# Patient Record
Sex: Female | Born: 1970
Health system: Southern US, Community
[De-identification: ages and names within clinical notes are randomized; demographics above are authoritative.]

## PROBLEM LIST (undated history)

## (undated) DIAGNOSIS — I10 Essential (primary) hypertension: Secondary | ICD-10-CM

## (undated) DIAGNOSIS — K219 Gastro-esophageal reflux disease without esophagitis: Secondary | ICD-10-CM

## (undated) DIAGNOSIS — K760 Fatty (change of) liver, not elsewhere classified: Secondary | ICD-10-CM

## (undated) DIAGNOSIS — K589 Irritable bowel syndrome without diarrhea: Secondary | ICD-10-CM

## (undated) DIAGNOSIS — D649 Anemia, unspecified: Secondary | ICD-10-CM

## (undated) DIAGNOSIS — F419 Anxiety disorder, unspecified: Secondary | ICD-10-CM

## (undated) DIAGNOSIS — D1803 Hemangioma of intra-abdominal structures: Secondary | ICD-10-CM

## (undated) HISTORY — PX: CHOLECYSTECTOMY: SHX55

## (undated) HISTORY — DX: Anemia, unspecified: D64.9

## (undated) HISTORY — DX: Fatty (change of) liver, not elsewhere classified: K76.0

## (undated) HISTORY — DX: Irritable bowel syndrome, unspecified: K58.9

## (undated) HISTORY — DX: Hemangioma of intra-abdominal structures: D18.03

## (undated) HISTORY — DX: Anxiety disorder, unspecified: F41.9

## (undated) HISTORY — PX: BREAST BIOPSY: SHX20

---

## 2017-03-21 DIAGNOSIS — Z6838 Body mass index (BMI) 38.0-38.9, adult: Secondary | ICD-10-CM | POA: Diagnosis not present

## 2017-03-21 DIAGNOSIS — R002 Palpitations: Secondary | ICD-10-CM | POA: Diagnosis not present

## 2017-03-21 DIAGNOSIS — I10 Essential (primary) hypertension: Secondary | ICD-10-CM | POA: Diagnosis not present

## 2017-03-21 DIAGNOSIS — F411 Generalized anxiety disorder: Secondary | ICD-10-CM | POA: Diagnosis not present

## 2017-03-26 DIAGNOSIS — F411 Generalized anxiety disorder: Secondary | ICD-10-CM | POA: Diagnosis not present

## 2017-04-15 DIAGNOSIS — F419 Anxiety disorder, unspecified: Secondary | ICD-10-CM | POA: Diagnosis not present

## 2017-04-15 DIAGNOSIS — Z6838 Body mass index (BMI) 38.0-38.9, adult: Secondary | ICD-10-CM | POA: Diagnosis not present

## 2017-04-15 DIAGNOSIS — R1033 Periumbilical pain: Secondary | ICD-10-CM | POA: Diagnosis not present

## 2017-04-15 DIAGNOSIS — R030 Elevated blood-pressure reading, without diagnosis of hypertension: Secondary | ICD-10-CM | POA: Diagnosis not present

## 2017-04-15 DIAGNOSIS — N926 Irregular menstruation, unspecified: Secondary | ICD-10-CM | POA: Diagnosis not present

## 2017-05-12 DIAGNOSIS — R51 Headache: Secondary | ICD-10-CM | POA: Diagnosis not present

## 2017-05-12 DIAGNOSIS — H9201 Otalgia, right ear: Secondary | ICD-10-CM | POA: Diagnosis not present

## 2017-05-12 DIAGNOSIS — F411 Generalized anxiety disorder: Secondary | ICD-10-CM | POA: Diagnosis not present

## 2017-05-19 DIAGNOSIS — Z6833 Body mass index (BMI) 33.0-33.9, adult: Secondary | ICD-10-CM | POA: Diagnosis not present

## 2017-05-19 DIAGNOSIS — R51 Headache: Secondary | ICD-10-CM | POA: Diagnosis not present

## 2017-05-28 DIAGNOSIS — R51 Headache: Secondary | ICD-10-CM | POA: Diagnosis not present

## 2017-05-28 DIAGNOSIS — H9201 Otalgia, right ear: Secondary | ICD-10-CM | POA: Diagnosis not present

## 2017-05-30 DIAGNOSIS — R2689 Other abnormalities of gait and mobility: Secondary | ICD-10-CM | POA: Diagnosis not present

## 2017-05-30 DIAGNOSIS — F411 Generalized anxiety disorder: Secondary | ICD-10-CM | POA: Diagnosis not present

## 2017-05-30 DIAGNOSIS — R009 Unspecified abnormalities of heart beat: Secondary | ICD-10-CM | POA: Diagnosis not present

## 2017-05-30 DIAGNOSIS — Z6830 Body mass index (BMI) 30.0-30.9, adult: Secondary | ICD-10-CM | POA: Diagnosis not present

## 2017-05-30 DIAGNOSIS — I10 Essential (primary) hypertension: Secondary | ICD-10-CM | POA: Diagnosis not present

## 2017-06-01 DIAGNOSIS — M792 Neuralgia and neuritis, unspecified: Secondary | ICD-10-CM | POA: Diagnosis not present

## 2017-06-01 DIAGNOSIS — R5381 Other malaise: Secondary | ICD-10-CM | POA: Diagnosis not present

## 2017-06-01 DIAGNOSIS — F329 Major depressive disorder, single episode, unspecified: Secondary | ICD-10-CM | POA: Diagnosis not present

## 2017-06-01 DIAGNOSIS — F419 Anxiety disorder, unspecified: Secondary | ICD-10-CM | POA: Diagnosis not present

## 2017-06-01 DIAGNOSIS — R42 Dizziness and giddiness: Secondary | ICD-10-CM | POA: Diagnosis not present

## 2017-06-01 DIAGNOSIS — R51 Headache: Secondary | ICD-10-CM | POA: Diagnosis not present

## 2017-06-01 DIAGNOSIS — M542 Cervicalgia: Secondary | ICD-10-CM | POA: Diagnosis not present

## 2017-06-01 DIAGNOSIS — I10 Essential (primary) hypertension: Secondary | ICD-10-CM | POA: Diagnosis not present

## 2017-06-23 DIAGNOSIS — M542 Cervicalgia: Secondary | ICD-10-CM | POA: Diagnosis not present

## 2017-06-25 DIAGNOSIS — M542 Cervicalgia: Secondary | ICD-10-CM | POA: Diagnosis not present

## 2017-06-28 DIAGNOSIS — M542 Cervicalgia: Secondary | ICD-10-CM | POA: Diagnosis not present

## 2017-07-02 DIAGNOSIS — M542 Cervicalgia: Secondary | ICD-10-CM | POA: Diagnosis not present

## 2017-07-07 DIAGNOSIS — M542 Cervicalgia: Secondary | ICD-10-CM | POA: Diagnosis not present

## 2017-07-12 DIAGNOSIS — M542 Cervicalgia: Secondary | ICD-10-CM | POA: Diagnosis not present

## 2017-07-14 DIAGNOSIS — M542 Cervicalgia: Secondary | ICD-10-CM | POA: Diagnosis not present

## 2017-07-19 DIAGNOSIS — M542 Cervicalgia: Secondary | ICD-10-CM | POA: Diagnosis not present

## 2017-07-21 DIAGNOSIS — M542 Cervicalgia: Secondary | ICD-10-CM | POA: Diagnosis not present

## 2017-07-26 DIAGNOSIS — M542 Cervicalgia: Secondary | ICD-10-CM | POA: Diagnosis not present

## 2017-07-28 DIAGNOSIS — M542 Cervicalgia: Secondary | ICD-10-CM | POA: Diagnosis not present

## 2017-08-11 DIAGNOSIS — M542 Cervicalgia: Secondary | ICD-10-CM | POA: Diagnosis not present

## 2017-08-24 ENCOUNTER — Telehealth: Payer: Self-pay | Admitting: Family

## 2017-08-24 DIAGNOSIS — J019 Acute sinusitis, unspecified: Secondary | ICD-10-CM

## 2017-08-24 DIAGNOSIS — B9689 Other specified bacterial agents as the cause of diseases classified elsewhere: Secondary | ICD-10-CM

## 2017-08-24 MED ORDER — AMOXICILLIN-POT CLAVULANATE 875-125 MG PO TABS: 1.0000 | ORAL_TABLET | Freq: Two times a day (BID) | ORAL | 0 refills | Status: DC

## 2017-08-24 NOTE — Progress Notes (Signed)

## 2017-08-30 DIAGNOSIS — M542 Cervicalgia: Secondary | ICD-10-CM | POA: Diagnosis not present

## 2017-09-01 DIAGNOSIS — M542 Cervicalgia: Secondary | ICD-10-CM | POA: Diagnosis not present

## 2017-09-09 DIAGNOSIS — Z6834 Body mass index (BMI) 34.0-34.9, adult: Secondary | ICD-10-CM | POA: Diagnosis not present

## 2017-09-09 DIAGNOSIS — R194 Change in bowel habit: Secondary | ICD-10-CM | POA: Diagnosis not present

## 2017-09-09 DIAGNOSIS — K589 Irritable bowel syndrome without diarrhea: Secondary | ICD-10-CM | POA: Diagnosis not present

## 2017-09-09 DIAGNOSIS — F419 Anxiety disorder, unspecified: Secondary | ICD-10-CM | POA: Diagnosis not present

## 2017-09-09 DIAGNOSIS — K219 Gastro-esophageal reflux disease without esophagitis: Secondary | ICD-10-CM | POA: Diagnosis not present

## 2017-09-09 DIAGNOSIS — R1013 Epigastric pain: Secondary | ICD-10-CM | POA: Diagnosis not present

## 2017-12-02 ENCOUNTER — Institutional Professional Consult (permissible substitution): Payer: Self-pay | Admitting: Emergency Medicine

## 2018-04-08 DIAGNOSIS — Z79899 Other long term (current) drug therapy: Secondary | ICD-10-CM | POA: Diagnosis not present

## 2018-04-08 DIAGNOSIS — I10 Essential (primary) hypertension: Secondary | ICD-10-CM | POA: Diagnosis not present

## 2018-04-08 DIAGNOSIS — H6503 Acute serous otitis media, bilateral: Secondary | ICD-10-CM | POA: Diagnosis not present

## 2018-04-08 DIAGNOSIS — J302 Other seasonal allergic rhinitis: Secondary | ICD-10-CM | POA: Diagnosis not present

## 2018-04-11 DIAGNOSIS — H6690 Otitis media, unspecified, unspecified ear: Secondary | ICD-10-CM | POA: Diagnosis not present

## 2018-04-11 DIAGNOSIS — Z6835 Body mass index (BMI) 35.0-35.9, adult: Secondary | ICD-10-CM | POA: Diagnosis not present

## 2018-04-11 DIAGNOSIS — R079 Chest pain, unspecified: Secondary | ICD-10-CM | POA: Diagnosis not present

## 2018-04-11 DIAGNOSIS — R5381 Other malaise: Secondary | ICD-10-CM | POA: Diagnosis not present

## 2018-04-11 DIAGNOSIS — J45909 Unspecified asthma, uncomplicated: Secondary | ICD-10-CM | POA: Diagnosis not present

## 2018-04-11 DIAGNOSIS — J019 Acute sinusitis, unspecified: Secondary | ICD-10-CM | POA: Diagnosis not present

## 2018-04-11 DIAGNOSIS — R0600 Dyspnea, unspecified: Secondary | ICD-10-CM | POA: Diagnosis not present

## 2018-04-26 DIAGNOSIS — F419 Anxiety disorder, unspecified: Secondary | ICD-10-CM | POA: Diagnosis not present

## 2018-04-26 DIAGNOSIS — G8929 Other chronic pain: Secondary | ICD-10-CM | POA: Diagnosis not present

## 2018-04-26 DIAGNOSIS — Z6835 Body mass index (BMI) 35.0-35.9, adult: Secondary | ICD-10-CM | POA: Diagnosis not present

## 2018-04-26 DIAGNOSIS — K589 Irritable bowel syndrome without diarrhea: Secondary | ICD-10-CM | POA: Diagnosis not present

## 2018-04-26 DIAGNOSIS — R1011 Right upper quadrant pain: Secondary | ICD-10-CM | POA: Diagnosis not present

## 2018-04-26 DIAGNOSIS — K219 Gastro-esophageal reflux disease without esophagitis: Secondary | ICD-10-CM | POA: Diagnosis not present

## 2018-05-16 ENCOUNTER — Encounter (HOSPITAL_COMMUNITY): Payer: Self-pay | Admitting: *Deleted

## 2018-05-16 ENCOUNTER — Emergency Department (HOSPITAL_COMMUNITY): Payer: 59

## 2018-05-16 ENCOUNTER — Emergency Department (HOSPITAL_COMMUNITY)
Admission: EM | Admit: 2018-05-16 | Discharge: 2018-05-16 | Disposition: A | Discharge status: Home / Self Care | Payer: 59 | Attending: Emergency Medicine | Admitting: Emergency Medicine

## 2018-05-16 DIAGNOSIS — I10 Essential (primary) hypertension: Secondary | ICD-10-CM | POA: Insufficient documentation

## 2018-05-16 DIAGNOSIS — M542 Cervicalgia: Secondary | ICD-10-CM | POA: Diagnosis not present

## 2018-05-16 DIAGNOSIS — Z79899 Other long term (current) drug therapy: Secondary | ICD-10-CM | POA: Insufficient documentation

## 2018-05-16 DIAGNOSIS — R079 Chest pain, unspecified: Secondary | ICD-10-CM | POA: Diagnosis present

## 2018-05-16 HISTORY — DX: Essential (primary) hypertension: I10

## 2018-05-16 LAB — I-STAT TROPONIN, ED
Troponin i, poc: 0 ng/mL (ref 0.00–0.08)
Troponin i, poc: 0.01 ng/mL (ref 0.00–0.08)

## 2018-05-16 LAB — I-STAT BETA HCG BLOOD, ED (MC, WL, AP ONLY)

## 2018-05-16 LAB — BASIC METABOLIC PANEL
ANION GAP: 10 (ref 5–15)
BUN: 10 mg/dL (ref 6–20)
CHLORIDE: 106 mmol/L (ref 98–111)
CO2: 25 mmol/L (ref 22–32)
Calcium: 8.8 mg/dL — ABNORMAL LOW (ref 8.9–10.3)
Creatinine, Ser: 0.7 mg/dL (ref 0.44–1.00)
Glucose, Bld: 105 mg/dL — ABNORMAL HIGH (ref 70–99)
POTASSIUM: 4.2 mmol/L (ref 3.5–5.1)
SODIUM: 141 mmol/L (ref 135–145)

## 2018-05-16 LAB — CBC
HEMATOCRIT: 41 % (ref 36.0–46.0)
HEMOGLOBIN: 13.4 g/dL (ref 12.0–15.0)
MCH: 30.4 pg (ref 26.0–34.0)
MCHC: 32.7 g/dL (ref 30.0–36.0)
MCV: 93 fL (ref 78.0–100.0)
Platelets: 260 10*3/uL (ref 150–400)
RBC: 4.41 MIL/uL (ref 3.87–5.11)
RDW: 13.3 % (ref 11.5–15.5)
WBC: 12.3 10*3/uL — AB (ref 4.0–10.5)

## 2018-05-16 MED ORDER — ASPIRIN 81 MG PO CHEW
324.0000 mg | CHEWABLE_TABLET | Freq: Once | ORAL | Status: AC
  Administered 2018-05-16: 324 mg via ORAL
  Filled 2018-05-16: qty 4

## 2018-05-16 NOTE — ED Triage Notes (Addendum)
Pt complains of elevated BP, blurred vision, neck pain. Pt had BP of 171/101 when checked today. Pt works at [employer], was walking down hallway when symptoms began. Pt states her vision was blurred for a few minutes, states blurred vision has improved but neck still hurts. Pt was prescribed antihypertension medication a few weeks ago but states she is scared to take it.

## 2018-05-16 NOTE — Discharge Instructions (Addendum)
Your blood pressure normalized to a suitable range with your verapamil and is 135/79 at discharge down from an initial blood pressure of 180/101.  Please take your medication daily as directed.  Your work-up here is otherwise unremarkable and I do not think he had an emergent cause of your symptoms today however I do think it is imperative that you continue to monitor and control your blood pressure to prevent any long-term or catastrophic outcomes.  Please follow-up in the next 2 days with your physician.  Contact a health care provider if: You think you are having a reaction to a medicine you are taking. You have headaches that keep coming back (recurring). You feel dizzy. You have swelling in your ankles. You have trouble with your vision. Get help right away if: You develop a severe headache or confusion. You have unusual weakness or numbness. You feel faint. You have severe pain in your chest or abdomen. You vomit repeatedly. You have trouble breathing.

## 2018-05-16 NOTE — ED Provider Notes (Signed)
[facility] DEPT Provider Note   CSN: [id number] Arrival date & time: 05/16/18  1045     History   Chief Complaint Chief Complaint  Patient presents with  . Hypertension  . Neck Pain  . Blurred Vision    HPI Kimble Delaurentis is a 47 y.o. female who presents the emergency department with chief complaint of chest pain.  Patient is a 47 year old female with a past medical history of hypertension.  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  Her PCP started on her verapamil.  The patient states that she has been afraid to take the medication because the label says that she cannot take it with grapefruit and states "that cannot be good."  Today the patient was under stressful situation at work states that she began having chest pain radiating into the right side of her neck.  She states that she also felt like her eyes became blurry.  All of her sensations lasted for about 10 minutes.  She went into the IR suite here and was noted to have hypertension.  The patient has not been taking any of her medications.  She is asymptomatic currently denies chest pain, shortness of breath, diaphoresis.  She has no other risk factors for ACS.  The patient denies headache.  She has no other changes in vision such as diplopia or vision loss. HPI  Past Medical History:  Diagnosis Date  . Hypertension     There are no active problems to display for this patient.    The histories are not reviewed yet. Please review them in the "History" navigator section and refresh this [place].   OB History   None      Home Medications    Prior to Admission medications   Medication Sig Start Date End Date Taking? Authorizing Provider  fluticasone (FLONASE) 50 MCG/ACT nasal spray Place 1 spray into both nostrils 2  (two) times daily as needed for allergies or rhinitis.   Yes [provider]  verapamil (VERELAN) 100 MG 24 hr capsule Take 100 mg by mouth daily. 04/12/18  Yes [provider]  amoxicillin-clavulanate (AUGMENTIN) 875-125 MG tablet Take 1 tablet by mouth 2 (two) times daily. Patient not taking: Reported on 05/16/2018 08/24/17   Kennyth Arnold, FNP    Family History No family history on file.  Social History Social History   Tobacco Use  . Smoking status: Not on file  Substance Use Topics  . Alcohol use: Not on file  . Drug use: Not on file     Allergies   Doxycycline and Minocycline   Review of Systems Review of Systems Ten systems reviewed and are negative for acute change, except as noted in the HPI.    Physical Exam Updated Vital Signs BP (!) 153/90   Pulse 91   Temp 98.2 F (36.8 C) (Oral)   Resp 16   LMP 04/15/2018 (Approximate)   SpO2 100%   Physical Exam  Constitutional: She is oriented to person, place, and time. She appears well-developed and well-nourished. No distress.  HENT:  Head: Normocephalic and atraumatic.  Eyes: Pupils are equal, round, and reactive to light. Conjunctivae and EOM are normal. No scleral icterus.  Neck: Normal range of motion.  Cardiovascular: Normal rate, regular rhythm and normal heart sounds. Exam reveals no  gallop and no friction rub.  No murmur heard. Pulmonary/Chest: Effort normal and breath sounds normal. No respiratory distress.  Abdominal: Soft. Bowel sounds are normal. She exhibits no distension and no mass. There is no tenderness. There is no guarding.  Musculoskeletal: She exhibits tenderness. She exhibits no edema or deformity.  Tenderness along the right side of the neck, pain with right lateral flexion and right lateral rotation  Neurological: She is alert and oriented to person, place, and time.  Speech is clear and goal oriented, follows commands Major Cranial nerves without deficit, no facial  droop Normal strength in upper and lower extremities bilaterally including dorsiflexion and plantar flexion, strong and equal grip strength Sensation normal to light and sharp touch Moves extremities without ataxia, coordination intact Normal finger to nose and rapid alternating movements Neg romberg, no pronator drift Normal gait Normal heel-shin and balance   Skin: Skin is warm and dry. Capillary refill takes less than 2 seconds. She is not diaphoretic.  Psychiatric: Her behavior is normal.  Nursing note and vitals reviewed.    ED Treatments / Results  Labs (all labs ordered are listed, but only abnormal results are displayed) Labs Reviewed  BASIC METABOLIC PANEL - Abnormal; Notable for the following components:      Result Value   Glucose, Bld 105 (*)    Calcium 8.8 (*)    All other components within normal limits  CBC - Abnormal; Notable for the following components:   WBC 12.3 (*)    All other components within normal limits  I-STAT TROPONIN, ED  I-STAT BETA HCG BLOOD, ED ([facility], [facility], [facility] ONLY)  I-STAT TROPONIN, ED    EKG EKG Interpretation  Date/Time:  Monday May 16 2018 12:21:04 EDT Ventricular Rate:  87 PR Interval:    QRS Duration: 76 QT Interval:  361 QTC Calculation: 435 R Axis:   23 Text Interpretation:  Sinus rhythm No old tracing to compare Confirmed by Duffy Bruce [phone]) on 05/16/2018 12:59:39 PM Also confirmed by Duffy Bruce [phone]), editor Shon Hale [phone])  on 05/16/2018 2:01:20 PM   Radiology Dg Chest 2 View  Result Date: 05/16/2018 CLINICAL DATA:  Blurry vision and hypertension. EXAM: CHEST - 2 VIEW COMPARISON:  None. FINDINGS: The heart size and mediastinal contours are within normal limits. Both lungs are clear. The visualized skeletal structures are unremarkable. IMPRESSION: Normal chest. Electronically Signed   By: Titus Dubin M.D.   On: 05/16/2018 12:56    Procedures Procedures (including critical care time)  Medications  Ordered in ED Medications  aspirin chewable tablet 324 mg (324 mg Oral Given 05/16/18 1236)     Initial Impression / Assessment and Plan / ED Course  I have reviewed the triage vital signs and the nursing notes.  Pertinent labs & imaging results that were available during my care of the patient were reviewed by me and considered in my medical decision making (see chart for details).  Clinical Course as of May 16 1541  Mon May 16, 2018  1412 Patient's BP improved after her oral verapimil.   BP(!): 153/90 [AH]  1538 Pulse Rate: 83 [AH]  1539 Patient's blood pressure has normalized with her verapamil.  Her EKG is unremarkable for ischemic abnormality she has 2- troponins here in the emergency department and a heart score of 3 making her low risk for ACS.  BP: 135/79 [AH]  1540 Patient has a normal neurologic examination I have very low suspicion for TIA.  She has under significant stress  and I do believe that she may have had a stress reaction or panic attack.   [AH]  1540 Patient meets PERC criteria.   [AH]  5974 I reviewed the patient's 2 view chest x-ray and see no significant abnormality such as consolidation, pneumothorax, mediastinal widening suggestive of aortic dissection/aneurysm.  I agree with the radiologic interpretation.   [AH]    Clinical Course User Index [AH] Margarita Mail, PA-C     Patient's blood pressure has decreased significantly with a single dose of oral medications.  She is now normotensive down from a pressure of 180/101.  She has a normal neurologic exam, 2- troponins, low risk heart score.  The patient states that she had marketed emotional stress at work this morning which I think is contributing to her symptoms today but feels much better at this time.  She is currently asymptomatic.  Encourage the patient to continue taking her blood pressure medications and see her PCP in the next 2 days.  We discussed return precautions.  She appears appropriate for  discharge at this time Final Clinical Impressions(s) / ED Diagnoses   Final diagnoses:  Hypertension, unspecified type    ED Discharge Orders    None       Margarita Mail, PA-C 05/16/18 1650    Duffy Bruce, MD 05/17/18 [phone]

## 2018-06-21 DIAGNOSIS — Z6836 Body mass index (BMI) 36.0-36.9, adult: Secondary | ICD-10-CM | POA: Diagnosis not present

## 2018-06-21 DIAGNOSIS — R51 Headache: Secondary | ICD-10-CM | POA: Diagnosis not present

## 2018-06-21 DIAGNOSIS — R5381 Other malaise: Secondary | ICD-10-CM | POA: Diagnosis not present

## 2018-06-21 DIAGNOSIS — J019 Acute sinusitis, unspecified: Secondary | ICD-10-CM | POA: Diagnosis not present

## 2018-06-21 DIAGNOSIS — H6690 Otitis media, unspecified, unspecified ear: Secondary | ICD-10-CM | POA: Diagnosis not present

## 2018-06-21 DIAGNOSIS — Z0189 Encounter for other specified special examinations: Secondary | ICD-10-CM | POA: Diagnosis not present

## 2018-06-21 DIAGNOSIS — J309 Allergic rhinitis, unspecified: Secondary | ICD-10-CM | POA: Diagnosis not present

## 2018-08-03 DIAGNOSIS — M542 Cervicalgia: Secondary | ICD-10-CM | POA: Diagnosis not present

## 2018-08-03 DIAGNOSIS — R51 Headache: Secondary | ICD-10-CM | POA: Diagnosis not present

## 2018-09-06 DIAGNOSIS — J019 Acute sinusitis, unspecified: Secondary | ICD-10-CM | POA: Diagnosis not present

## 2018-09-06 DIAGNOSIS — H6690 Otitis media, unspecified, unspecified ear: Secondary | ICD-10-CM | POA: Diagnosis not present

## 2018-09-06 DIAGNOSIS — Z6837 Body mass index (BMI) 37.0-37.9, adult: Secondary | ICD-10-CM | POA: Diagnosis not present

## 2018-09-06 DIAGNOSIS — R42 Dizziness and giddiness: Secondary | ICD-10-CM | POA: Diagnosis not present

## 2018-09-06 DIAGNOSIS — R05 Cough: Secondary | ICD-10-CM | POA: Diagnosis not present

## 2018-11-02 DIAGNOSIS — R1033 Periumbilical pain: Secondary | ICD-10-CM | POA: Diagnosis not present

## 2018-11-02 DIAGNOSIS — R5381 Other malaise: Secondary | ICD-10-CM | POA: Diagnosis not present

## 2018-11-02 DIAGNOSIS — N951 Menopausal and female climacteric states: Secondary | ICD-10-CM | POA: Diagnosis not present

## 2018-11-02 DIAGNOSIS — Z0000 Encounter for general adult medical examination without abnormal findings: Secondary | ICD-10-CM | POA: Diagnosis not present

## 2018-11-02 DIAGNOSIS — R7309 Other abnormal glucose: Secondary | ICD-10-CM | POA: Diagnosis not present

## 2018-11-02 DIAGNOSIS — Z6838 Body mass index (BMI) 38.0-38.9, adult: Secondary | ICD-10-CM | POA: Diagnosis not present

## 2018-11-08 DIAGNOSIS — G8929 Other chronic pain: Secondary | ICD-10-CM | POA: Diagnosis not present

## 2018-11-08 DIAGNOSIS — K589 Irritable bowel syndrome without diarrhea: Secondary | ICD-10-CM | POA: Diagnosis not present

## 2018-11-08 DIAGNOSIS — K219 Gastro-esophageal reflux disease without esophagitis: Secondary | ICD-10-CM | POA: Diagnosis not present

## 2018-11-08 DIAGNOSIS — R1011 Right upper quadrant pain: Secondary | ICD-10-CM | POA: Diagnosis not present

## 2018-11-08 DIAGNOSIS — Z6838 Body mass index (BMI) 38.0-38.9, adult: Secondary | ICD-10-CM | POA: Diagnosis not present

## 2018-11-08 DIAGNOSIS — F419 Anxiety disorder, unspecified: Secondary | ICD-10-CM | POA: Diagnosis not present

## 2018-11-14 DIAGNOSIS — G8929 Other chronic pain: Secondary | ICD-10-CM | POA: Diagnosis not present

## 2018-11-14 DIAGNOSIS — K760 Fatty (change of) liver, not elsewhere classified: Secondary | ICD-10-CM | POA: Diagnosis not present

## 2018-11-14 DIAGNOSIS — R109 Unspecified abdominal pain: Secondary | ICD-10-CM | POA: Diagnosis not present

## 2018-11-21 MED FILL — OSCIMIN SL 0.125 MG TABLET: 0.125 | 20 days supply | Qty: 120 | Fill #0

## 2018-11-23 MED FILL — XIFAXAN 550 MG TABLET: 550 | 14 days supply | Qty: 42 | Fill #0

## 2018-11-25 DIAGNOSIS — R1011 Right upper quadrant pain: Secondary | ICD-10-CM | POA: Diagnosis not present

## 2018-11-28 DIAGNOSIS — Z01419 Encounter for gynecological examination (general) (routine) without abnormal findings: Secondary | ICD-10-CM | POA: Diagnosis not present

## 2018-11-28 DIAGNOSIS — R1033 Periumbilical pain: Secondary | ICD-10-CM | POA: Diagnosis not present

## 2018-11-29 DIAGNOSIS — Z01419 Encounter for gynecological examination (general) (routine) without abnormal findings: Secondary | ICD-10-CM | POA: Diagnosis not present

## 2018-12-02 DIAGNOSIS — R1033 Periumbilical pain: Secondary | ICD-10-CM | POA: Diagnosis not present

## 2019-04-04 DIAGNOSIS — R87810 Cervical high risk human papillomavirus (HPV) DNA test positive: Secondary | ICD-10-CM | POA: Diagnosis not present

## 2019-04-04 DIAGNOSIS — Z124 Encounter for screening for malignant neoplasm of cervix: Secondary | ICD-10-CM | POA: Diagnosis not present

## 2019-04-04 DIAGNOSIS — F439 Reaction to severe stress, unspecified: Secondary | ICD-10-CM | POA: Diagnosis not present

## 2019-04-04 DIAGNOSIS — F419 Anxiety disorder, unspecified: Secondary | ICD-10-CM | POA: Diagnosis not present

## 2019-04-04 DIAGNOSIS — R87610 Atypical squamous cells of undetermined significance on cytologic smear of cervix (ASC-US): Secondary | ICD-10-CM | POA: Diagnosis not present

## 2019-04-04 DIAGNOSIS — N921 Excessive and frequent menstruation with irregular cycle: Secondary | ICD-10-CM | POA: Diagnosis not present

## 2019-04-05 DIAGNOSIS — N921 Excessive and frequent menstruation with irregular cycle: Secondary | ICD-10-CM | POA: Diagnosis not present

## 2019-04-20 DIAGNOSIS — R87610 Atypical squamous cells of undetermined significance on cytologic smear of cervix (ASC-US): Secondary | ICD-10-CM | POA: Diagnosis not present

## 2019-04-20 DIAGNOSIS — N72 Inflammatory disease of cervix uteri: Secondary | ICD-10-CM | POA: Diagnosis not present

## 2019-04-20 DIAGNOSIS — R87618 Other abnormal cytological findings on specimens from cervix uteri: Secondary | ICD-10-CM | POA: Diagnosis not present

## 2019-10-25 DIAGNOSIS — F329 Major depressive disorder, single episode, unspecified: Secondary | ICD-10-CM | POA: Diagnosis not present

## 2019-10-25 DIAGNOSIS — R079 Chest pain, unspecified: Secondary | ICD-10-CM | POA: Diagnosis not present

## 2019-10-25 DIAGNOSIS — R5381 Other malaise: Secondary | ICD-10-CM | POA: Diagnosis not present

## 2019-10-25 DIAGNOSIS — G4700 Insomnia, unspecified: Secondary | ICD-10-CM | POA: Diagnosis not present

## 2019-10-25 DIAGNOSIS — F419 Anxiety disorder, unspecified: Secondary | ICD-10-CM | POA: Diagnosis not present

## 2019-10-25 DIAGNOSIS — K589 Irritable bowel syndrome without diarrhea: Secondary | ICD-10-CM | POA: Diagnosis not present

## 2019-10-25 DIAGNOSIS — R1033 Periumbilical pain: Secondary | ICD-10-CM | POA: Diagnosis not present

## 2020-03-13 ENCOUNTER — Telehealth: Payer: Self-pay | Admitting: Nurse Practitioner

## 2020-03-13 DIAGNOSIS — J00 Acute nasopharyngitis [common cold]: Secondary | ICD-10-CM

## 2020-03-13 MED ORDER — FLUTICASONE PROPIONATE 50 MCG/ACT NA SUSP
2.0000 | Freq: Every day | NASAL | 6 refills | Status: DC
Start: 2020-03-13 — End: 2021-04-16

## 2020-03-13 MED ORDER — MECLIZINE HCL 25 MG PO TABS: 25.0000 mg | ORAL_TABLET | Freq: Three times a day (TID) | ORAL | 0 refills | Status: DC | PRN

## 2020-03-13 NOTE — Addendum Note (Signed)
Addended by: Chevis Pretty on: 03/13/2020 10:58 AM   Modules accepted: Orders

## 2020-03-13 NOTE — Progress Notes (Signed)
We are sorry you are not feeling well.  Here is how we plan to help!  Based on what you have shared with me, it looks like you may have a viral upper respiratory infection.  Upper respiratory infections are caused by a large number of viruses; however, rhinovirus is the most common cause.   Symptoms vary from person to person, with common symptoms including sore throat, cough, fatigue or lack of energy and feeling of general discomfort.  A low-grade fever of up to 100.4 may present, but is often uncommon.  Symptoms vary however, and are closely related to a person's age or underlying illnesses.  The most common symptoms associated with an upper respiratory infection are nasal discharge or congestion, cough, sneezing, headache and pressure in the ears and face.  These symptoms usually persist for about 3 to 10 days, but can last up to 2 weeks.  It is important to know that upper respiratory infections do not cause serious illness or complications in most cases.    Upper respiratory infections can be transmitted from person to person, with the most common method of transmission being a person's hands.  The virus is able to live on the skin and can infect other persons for up to 2 hours after direct contact.  Also, these can be transmitted when someone coughs or sneezes; thus, it is important to cover the mouth to reduce this risk.  To keep the spread of the illness at [place], good hand hygiene is very important.  This is an infection that is most likely caused by a virus. There are no specific treatments other than to help you with the symptoms until the infection runs its course.  We are sorry you are not feeling well.  Here is how we plan to help!   For nasal congestion, you may use an oral decongestants such as Mucinex D or if you have glaucoma or high blood pressure use plain Mucinex.  Saline nasal spray or nasal drops can help and can safely be used as often as needed for congestion.  For your congestion,  I have prescribed Fluticasone nasal spray one spray in each nostril twice a day  If you do not have a history of heart disease, hypertension, diabetes or thyroid disease, prostate/bladder issues or glaucoma, you may also use Sudafed to treat nasal congestion.  It is highly recommended that you consult with a pharmacist or your primary care physician to ensure this medication is safe for you to take.     If you have a cough, you may use cough suppressants such as Delsym and Robitussin.  If you have glaucoma or high blood pressure, you can also use Coricidin HBP.    If you have a sore or scratchy throat, use a saltwater gargle-  to  teaspoon of salt dissolved in a 4-ounce to 8-ounce glass of warm water.  Gargle the solution for approximately 15-30 seconds and then spit.  It is important not to swallow the solution.  You can also use throat lozenges/cough drops and Chloraseptic spray to help with throat pain or discomfort.  Warm or cold liquids can also be helpful in relieving throat pain.  For headache, pain or general discomfort, you can use Ibuprofen or Tylenol as directed.   Some authorities believe that zinc sprays or the use of Echinacea may shorten the course of your symptoms.   HOME CARE . Only take medications as instructed by your medical team. . Be sure to drink plenty  of fluids. Water is fine as well as fruit juices, sodas and electrolyte beverages. You may want to stay away from caffeine or alcohol. If you are nauseated, try taking small sips of liquids. How do you know if you are getting enough fluid? Your urine should be a pale yellow or almost colorless. . Get rest. . Taking a steamy shower or using a humidifier may help nasal congestion and ease sore throat pain. You can place a towel over your head and breathe in the steam from hot water coming from a faucet. . Using a saline nasal spray works much the same way. . Cough drops, hard candies and sore throat lozenges may ease your  cough. . Avoid close contacts especially the very young and the elderly . Cover your mouth if you cough or sneeze . Always remember to wash your hands.   GET HELP RIGHT AWAY IF: . You develop worsening fever. . If your symptoms do not improve within 10 days . You develop yellow or green discharge from your nose over 3 days. . You have coughing fits . You develop a severe head ache or visual changes. . You develop shortness of breath, difficulty breathing or start having chest pain . Your symptoms persist after you have completed your treatment plan  MAKE SURE YOU   Understand these instructions.  Will watch your condition.  Will get help right away if you are not doing well or get worse.  Your e-visit answers were reviewed by a board certified advanced clinical practitioner to complete your personal care plan. Depending upon the condition, your plan could have included both over the counter or prescription medications. Please review your pharmacy choice. If there is a problem, you may call our nursing hot line at and have the prescription routed to another pharmacy. Your safety is important to us. If you have drug allergies check your prescription carefully.   You can use MyChart to ask questions about today's visit, request a non-urgent call back, or ask for a work or school excuse for 24 hours related to this e-Visit. If it has been greater than 24 hours you will need to follow up with your provider, or enter a new e-Visit to address those concerns. You will get an e-mail in the next two days asking about your experience.  I hope that your e-visit has been valuable and will speed your recovery. Thank you for using e-visits.   5-10 minutes spent reviewing and documenting in chart.    

## 2020-03-14 DIAGNOSIS — R42 Dizziness and giddiness: Secondary | ICD-10-CM | POA: Diagnosis not present

## 2020-03-14 DIAGNOSIS — J019 Acute sinusitis, unspecified: Secondary | ICD-10-CM | POA: Diagnosis not present

## 2020-04-04 ENCOUNTER — Encounter (HOSPITAL_COMMUNITY): Payer: Self-pay

## 2020-04-04 ENCOUNTER — Encounter (HOSPITAL_COMMUNITY): Payer: Self-pay | Admitting: *Deleted

## 2020-04-04 ENCOUNTER — Emergency Department (HOSPITAL_COMMUNITY): Payer: 59

## 2020-04-04 ENCOUNTER — Emergency Department (HOSPITAL_COMMUNITY)
Admission: EM | Admit: 2020-04-04 | Discharge: 2020-04-04 | Disposition: A | Discharge status: Home / Self Care | Payer: 59 | Attending: Emergency Medicine | Admitting: Emergency Medicine

## 2020-04-04 ENCOUNTER — Other Ambulatory Visit: Payer: Self-pay

## 2020-04-04 DIAGNOSIS — R0981 Nasal congestion: Secondary | ICD-10-CM | POA: Diagnosis not present

## 2020-04-04 DIAGNOSIS — I10 Essential (primary) hypertension: Secondary | ICD-10-CM | POA: Diagnosis not present

## 2020-04-04 DIAGNOSIS — R42 Dizziness and giddiness: Secondary | ICD-10-CM | POA: Insufficient documentation

## 2020-04-04 DIAGNOSIS — R079 Chest pain, unspecified: Secondary | ICD-10-CM | POA: Diagnosis not present

## 2020-04-04 DIAGNOSIS — R0989 Other specified symptoms and signs involving the circulatory and respiratory systems: Secondary | ICD-10-CM | POA: Diagnosis not present

## 2020-04-04 DIAGNOSIS — R062 Wheezing: Secondary | ICD-10-CM | POA: Insufficient documentation

## 2020-04-04 DIAGNOSIS — R072 Precordial pain: Secondary | ICD-10-CM | POA: Diagnosis not present

## 2020-04-04 DIAGNOSIS — Z20822 Contact with and (suspected) exposure to covid-19: Secondary | ICD-10-CM | POA: Diagnosis not present

## 2020-04-04 DIAGNOSIS — R0602 Shortness of breath: Secondary | ICD-10-CM | POA: Insufficient documentation

## 2020-04-04 DIAGNOSIS — R0789 Other chest pain: Secondary | ICD-10-CM | POA: Diagnosis not present

## 2020-04-04 HISTORY — DX: Gastro-esophageal reflux disease without esophagitis: K21.9

## 2020-04-04 LAB — CBC
HCT: 42.5 % (ref 36.0–46.0)
Hemoglobin: 13.7 g/dL (ref 12.0–15.0)
MCH: 29.9 pg (ref 26.0–34.0)
MCHC: 32.2 g/dL (ref 30.0–36.0)
MCV: 92.8 fL (ref 80.0–100.0)
Platelets: 280 10*3/uL (ref 150–400)
RBC: 4.58 MIL/uL (ref 3.87–5.11)
RDW: 13.9 % (ref 11.5–15.5)
WBC: 12.5 10*3/uL — ABNORMAL HIGH (ref 4.0–10.5)
nRBC: 0 % (ref 0.0–0.2)

## 2020-04-04 LAB — HCG, QUANTITATIVE, PREGNANCY: hCG, Beta Chain, Quant, S: <1 m[IU]/mL (ref <5)

## 2020-04-04 LAB — BASIC METABOLIC PANEL
Anion gap: 13 (ref 5–15)
BUN: 12 mg/dL (ref 6–20)
CO2: 25 mmol/L (ref 22–32)
Calcium: 9.5 mg/dL (ref 8.9–10.3)
Chloride: 102 mmol/L (ref 98–111)
Creatinine, Ser: 0.7 mg/dL (ref 0.44–1.00)
GFR calc Af Amer: >60 mL/min (ref >60)
GFR calc non Af Amer: >60 mL/min (ref >60)
Glucose, Bld: 100 mg/dL — ABNORMAL HIGH (ref 70–99)
Potassium: 3.9 mmol/L (ref 3.5–5.1)
Sodium: 140 mmol/L (ref 135–145)

## 2020-04-04 LAB — SARS CORONAVIRUS 2 BY RT PCR (HOSPITAL ORDER, PERFORMED IN ~~LOC~~ HOSPITAL LAB): SARS Coronavirus 2: NEGATIVE

## 2020-04-04 LAB — I-STAT BETA HCG BLOOD, ED (MC, WL, AP ONLY): I-stat hCG, quantitative: 5.7 m[IU]/mL — ABNORMAL HIGH (ref <5)

## 2020-04-04 LAB — D-DIMER, QUANTITATIVE: D-Dimer, Quant: 0.32 ug/mL-FEU (ref 0.00–0.50)

## 2020-04-04 LAB — BRAIN NATRIURETIC PEPTIDE: B Natriuretic Peptide: 22.2 pg/mL (ref 0.0–100.0)

## 2020-04-04 LAB — TROPONIN I (HIGH SENSITIVITY)
Troponin I (High Sensitivity): <2 ng/L (ref <18)
Troponin I (High Sensitivity): 2 ng/L (ref <18)

## 2020-04-04 MED ORDER — ALBUTEROL SULFATE HFA 108 (90 BASE) MCG/ACT IN AERS
2.0000 | INHALATION_SPRAY | Freq: Once | RESPIRATORY_TRACT | Status: DC
  Filled 2020-04-04: qty 6.7

## 2020-04-04 MED ORDER — SODIUM CHLORIDE 0.9 % IV BOLUS
500.0000 mL | Freq: Once | INTRAVENOUS | Status: AC
  Administered 2020-04-04: 500 mL via INTRAVENOUS

## 2020-04-04 MED ORDER — PANTOPRAZOLE SODIUM 40 MG PO TBEC: 40.0000 mg | DELAYED_RELEASE_TABLET | Freq: Every day | ORAL | 0 refills | Status: DC

## 2020-04-04 MED ORDER — LEVALBUTEROL TARTRATE 45 MCG/ACT IN AERO
2.0000 | INHALATION_SPRAY | Freq: Once | RESPIRATORY_TRACT | Status: AC
  Administered 2020-04-04: 2 via RESPIRATORY_TRACT
  Filled 2020-04-04: qty 15

## 2020-04-04 MED ORDER — MECLIZINE HCL 25 MG PO TABS
25.0000 mg | ORAL_TABLET | Freq: Once | ORAL | Status: AC
  Administered 2020-04-04: 25 mg via ORAL
  Filled 2020-04-04: qty 1

## 2020-04-04 MED ORDER — SODIUM CHLORIDE 0.9% FLUSH: 3.0000 mL | Freq: Once | INTRAVENOUS | Status: DC

## 2020-04-04 MED FILL — PANTOPRAZOLE SOD DR 40 MG T: 40 | 30 days supply | Qty: 30 | Fill #0

## 2020-04-04 NOTE — ED Provider Notes (Signed)
Emergency Department Provider Note   I have reviewed the triage vital signs and the nursing notes.   HISTORY  Chief Complaint Chest Pain and Shortness of Breath   HPI Peggy Wilkerson is a 49 y.o. female with PMH of GERD and prior wheezing presents to the ED with 5 days of circumferential chest tightness with associated shortness of breath.  [place] the beach [place] when symptoms began.  She initially appreciated some wheezing.  This was in the setting of nasal congestion and some dizziness.  She states her husband had similar symptoms.  She did get the first dose of the Covid vaccine and notes some wheezing starting several days after that.  She was started on antibiotic and nasal spray by her PCP.  She stopped taking the antibiotic with no infection symptoms.  She does have history of wheezing in the past and has inhaler and nebulizer machine at home but states she has not had to use these in the past 2 years and did not try these with symptoms today.  She is not having any unilateral leg pain or swelling.  No pleuritic component to her pain. No fever or shaking chills.    Past Medical History:  Diagnosis Date  . GERD (gastroesophageal reflux disease)     There are no problems to display for this patient.   Past Surgical History:  Procedure Laterality Date  . CHOLECYSTECTOMY      Allergies Patient has no known allergies.  Family History  Problem Relation Age of Onset  . Cancer Father     Social History Social History   Tobacco Use  . Smoking status: Never Smoker  . Smokeless tobacco: Never Used  Vaping Use  . Vaping Use: Never used  Substance Use Topics  . Alcohol use: Never  . Drug use: Never    Review of Systems  Constitutional: No fever/chills Eyes: No visual changes. ENT: No sore throat. Positive nasal congestion.  Cardiovascular: Positive chest pain. Respiratory: Positive shortness of breath and wheezing.  Gastrointestinal: No abdominal pain.  No  nausea, no vomiting.  No diarrhea.  No constipation. Genitourinary: Negative for dysuria. Musculoskeletal: Negative for back pain. Skin: Negative for rash. Neurological: Negative for headaches, focal weakness or numbness.  10-point ROS otherwise negative.  ____________________________________________   PHYSICAL EXAM:  VITAL SIGNS: ED Triage Vitals  Enc Vitals Group     BP 04/04/20 1223 (!) 192/93     Pulse Rate 04/04/20 1223 99     Resp 04/04/20 1223 16     Temp 04/04/20 1223 98.2 F (36.8 C)     Temp Source 04/04/20 1223 Oral     SpO2 04/04/20 1223 100 %     Weight 04/04/20 1224 220 lb (99.8 kg)     Height 04/04/20 1224 5\' 5"  (1.651 m)   Constitutional: Alert and oriented. Well appearing and in no acute distress. Eyes: Conjunctivae are normal. Head: Atraumatic. Ears:  Healthy appearing ear canals and TMs bilaterally. Trace effusion on the right without bulging or erythema.  Nose: No congestion/rhinnorhea. Mouth/Throat: Mucous membranes are moist.  Oropharynx with mild erythema posteriorly. No PTA or tonsillar exudate or hypertrophy.  Neck: No stridor.   Cardiovascular: Normal rate, regular rhythm. Good peripheral circulation. Grossly normal heart sounds. No murmurs.  Respiratory: Normal respiratory effort.  No retractions. Lungs CTAB. Gastrointestinal: Soft and nontender. No distention.  Musculoskeletal: No lower extremity tenderness with trace bilateral ankle edema. No gross deformities of extremities. Neurologic:  Normal speech  and language. No gross focal neurologic deficits are appreciated. No facial asymmetry. Normal finger to nose testing.  Skin:  Skin is warm, dry and intact. No rash noted.  ____________________________________________   LABS (all labs ordered are listed, but only abnormal results are displayed)  Labs Reviewed  BASIC METABOLIC PANEL - Abnormal; Notable for the following components:      Result Value   Glucose, Bld 100 (*)    All other  components within normal limits  CBC - Abnormal; Notable for the following components:   WBC 12.5 (*)    All other components within normal limits  I-STAT BETA HCG BLOOD, ED ([facility], [facility], [facility] ONLY) - Abnormal; Notable for the following components:   I-stat hCG, quantitative 5.7 (*)    All other components within normal limits  SARS CORONAVIRUS 2 BY RT PCR (HOSPITAL ORDER, [facility])  D-DIMER, QUANTITATIVE (NOT AT [facility])  BRAIN NATRIURETIC PEPTIDE  HCG, QUANTITATIVE, PREGNANCY  TROPONIN I (HIGH SENSITIVITY)  TROPONIN I (HIGH SENSITIVITY)   ____________________________________________  EKG   EKG Interpretation  Date/Time:  Thursday April 04 2020 12:19:08 EDT Ventricular Rate:  109 PR Interval:    QRS Duration: 77 QT Interval:  311 QTC Calculation: 419 R Axis:   23 Text Interpretation: Sinus tachycardia Probable left atrial enlargement Baseline wander in lead(s) I 12 Lead; [place] No STEMI Confirmed by Nanda Quinton [phone]) on 04/04/2020 12:35:47 PM       ____________________________________________  RADIOLOGY  DG Chest 2 View  Result Date: 04/04/2020 CLINICAL DATA:  Chest pain and shortness of breath. Additional provided: Intermittent mid chest tightness for 5 days, shortness of breath for 1 week. Indigestion and reflux. EXAM: CHEST - 2 VIEW COMPARISON:  No pertinent prior studies available for comparison. FINDINGS: Heart size within normal limits. There is no appreciable airspace consolidation. No evidence of pleural effusion or pneumothorax. No acute bony abnormality identified. IMPRESSION: No evidence of active cardiopulmonary disease. Electronically Signed   By: Kellie Simmering DO   On: 04/04/2020 13:47    ____________________________________________   PROCEDURES  Procedure(s) performed:   Procedures  None  ____________________________________________   INITIAL IMPRESSION / ASSESSMENT AND PLAN / ED COURSE  Pertinent labs & imaging results  that were available during my care of the patient were reviewed by me and considered in my medical decision making (see chart for details).   Patient presents emergency room with intermittent chest tightness over the past 5 days of shortness of breath and subjective wheezing.  URI type symptoms are present as well.  No focal neurologic deficit to suspect central process for the patient's dizziness.  Blood pressure is elevated here with borderline tachycardia but normal oxygen saturation and respiratory rate.  Plan for serial troponins, D-dimer, chest x-ray.  Will give an albuterol inhaler here to see if this opens the airways and causes some wheezing but none currently on exam.   03:41 PM  Patient's second heart enzyme is negative.  D-dimer negative.  Chest x-ray reviewed with no acute findings.  Patient is following with her PCP tomorrow and will discuss elevated blood pressures.  Advised that steroid may cause her blood pressures to temporarily be increased and to discuss this with her PCP.  I have also called in a prescription for Protonix which she will take daily and discuss with her PCP.  Discussed close ED return precautions.  ____________________________________________  FINAL CLINICAL IMPRESSION(S) / ED DIAGNOSES  Final diagnoses:  Precordial chest pain  SOB (shortness  of breath)  Sinus congestion  Vertigo     MEDICATIONS GIVEN DURING THIS VISIT:  Medications  levalbuterol (XOPENEX HFA) inhaler 2 puff (2 puffs Inhalation Given 04/04/20 1337)  sodium chloride 0.9 % bolus 500 mL (0 mLs Intravenous Stopped 04/04/20 1532)  meclizine (ANTIVERT) tablet 25 mg (25 mg Oral Given 04/04/20 1448)     NEW OUTPATIENT MEDICATIONS STARTED DURING THIS VISIT:  New Prescriptions   PANTOPRAZOLE (PROTONIX) 40 MG TABLET    Take 1 tablet (40 mg total) by mouth daily.    Note:  This document was prepared using Dragon voice recognition software and may include unintentional dictation errors.  Nanda Quinton, MD, [facility] Emergency Medicine    Frona Yost, [facility], MD 04/04/20 [phone]

## 2020-04-04 NOTE — ED Triage Notes (Signed)
Patient c/o intermittent mid chest tightness x 5 days. Patient also c/o SOB x 1 week.  Patient also c/o indigestion and reflux.

## 2020-04-04 NOTE — Discharge Instructions (Signed)
You were seen in the emergency room today with chest tightness and shortness of breath.  Please start the steroids prescribed by your PCP and continue the nasal spray.  You can take the meclizine as needed for dizziness.  Please follow closely with your primary care doctor but if your symptoms suddenly change or worsen you should return to the emergency department immediately for reevaluation.

## 2020-04-05 DIAGNOSIS — J019 Acute sinusitis, unspecified: Secondary | ICD-10-CM | POA: Diagnosis not present

## 2020-04-05 DIAGNOSIS — R079 Chest pain, unspecified: Secondary | ICD-10-CM | POA: Diagnosis not present

## 2020-04-05 DIAGNOSIS — Z0189 Encounter for other specified special examinations: Secondary | ICD-10-CM | POA: Diagnosis not present

## 2020-04-05 DIAGNOSIS — R202 Paresthesia of skin: Secondary | ICD-10-CM | POA: Diagnosis not present

## 2020-04-05 DIAGNOSIS — J309 Allergic rhinitis, unspecified: Secondary | ICD-10-CM | POA: Diagnosis not present

## 2020-04-05 DIAGNOSIS — R002 Palpitations: Secondary | ICD-10-CM | POA: Diagnosis not present

## 2020-04-05 DIAGNOSIS — R1033 Periumbilical pain: Secondary | ICD-10-CM | POA: Diagnosis not present

## 2020-04-05 DIAGNOSIS — R42 Dizziness and giddiness: Secondary | ICD-10-CM | POA: Diagnosis not present

## 2020-04-05 MED FILL — predniSONE 20 MG TABS: 20 | 6 days supply | Qty: 6 | Fill #0

## 2020-04-05 MED FILL — MECLIZINE HCL 25 MG TABS: 25 | 30 days supply | Qty: 45 | Fill #0

## 2020-04-12 ENCOUNTER — Telehealth: Payer: Self-pay | Admitting: Allergy & Immunology

## 2020-04-12 NOTE — Telephone Encounter (Signed)
New patient was calling to talk with some one about reaction she had 3 weeks ago today with first covid shot.she had vertigo.

## 2020-04-15 NOTE — Telephone Encounter (Signed)
Patient received first vaccine approximately 03-22-20. She developed severe headache, severe dizziness, feeling of off balance (vertigo) a week after receiving the vaccine. This progressively worsened over the course of a week. She also developed a feeling as if she could not breath. She did go to the ED and that note is available in the chart. She felt better after the ED visit and receiving albuterol inhaler. She did see her pcp since then. She is feeling better since then. Said that she may be coming out of whatever it was. I did ask about sinus symptoms and she told me at first she wondered if that was what it was. She does report environmental allergies to dust, grass pollen and tree pollen. She has not had polyethylene glycol that she is aware of, no previous vaccine reactions, no history of dermal fillers. She does have an allergy to a type of rubber found in cosmetic products and lotions but is unsure of the name. She would like to get the second dose of the vaccine. She also wonders if the vaccine has any neurological side effects or if it could ave been the cause for the issues that she had.

## 2020-04-15 NOTE — Telephone Encounter (Signed)
Thank you for reviewing.
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
## 2020-04-17 ENCOUNTER — Telehealth: Payer: Self-pay | Admitting: Family Medicine

## 2020-04-17 NOTE — Telephone Encounter (Signed)
Please advise to previous contact.  Patient called in returning call from Monday.

## 2020-04-17 NOTE — Telephone Encounter (Signed)
Patient notified of Dr. Julianne Rice advice via telephone. All questions answered.

## 2020-04-18 MED FILL — MECLIZINE HCL 25 MG TABS: 25 | 30 days supply | Qty: 45 | Fill #0

## 2020-04-18 MED FILL — PANTOPRAZOLE SOD DR 40 MG T: 40 | 30 days supply | Qty: 30 | Fill #0

## 2020-04-19 DIAGNOSIS — M2550 Pain in unspecified joint: Secondary | ICD-10-CM | POA: Diagnosis not present

## 2020-04-19 DIAGNOSIS — R519 Headache, unspecified: Secondary | ICD-10-CM | POA: Diagnosis not present

## 2020-04-19 DIAGNOSIS — M792 Neuralgia and neuritis, unspecified: Secondary | ICD-10-CM | POA: Diagnosis not present

## 2020-04-19 DIAGNOSIS — F329 Major depressive disorder, single episode, unspecified: Secondary | ICD-10-CM | POA: Diagnosis not present

## 2020-04-19 DIAGNOSIS — G4700 Insomnia, unspecified: Secondary | ICD-10-CM | POA: Diagnosis not present

## 2020-04-19 DIAGNOSIS — R42 Dizziness and giddiness: Secondary | ICD-10-CM | POA: Diagnosis not present

## 2020-04-19 DIAGNOSIS — R5381 Other malaise: Secondary | ICD-10-CM | POA: Diagnosis not present

## 2020-04-25 DIAGNOSIS — R519 Headache, unspecified: Secondary | ICD-10-CM | POA: Diagnosis not present

## 2020-04-25 DIAGNOSIS — R002 Palpitations: Secondary | ICD-10-CM | POA: Diagnosis not present

## 2020-04-25 DIAGNOSIS — R5381 Other malaise: Secondary | ICD-10-CM | POA: Diagnosis not present

## 2020-04-25 DIAGNOSIS — F419 Anxiety disorder, unspecified: Secondary | ICD-10-CM | POA: Diagnosis not present

## 2020-04-25 DIAGNOSIS — G4700 Insomnia, unspecified: Secondary | ICD-10-CM | POA: Diagnosis not present

## 2020-04-25 DIAGNOSIS — R42 Dizziness and giddiness: Secondary | ICD-10-CM | POA: Diagnosis not present

## 2020-04-26 DIAGNOSIS — E559 Vitamin D deficiency, unspecified: Secondary | ICD-10-CM | POA: Diagnosis not present

## 2020-04-26 DIAGNOSIS — N912 Amenorrhea, unspecified: Secondary | ICD-10-CM | POA: Diagnosis not present

## 2020-04-26 DIAGNOSIS — R5381 Other malaise: Secondary | ICD-10-CM | POA: Diagnosis not present

## 2020-04-26 DIAGNOSIS — R002 Palpitations: Secondary | ICD-10-CM | POA: Diagnosis not present

## 2020-04-26 DIAGNOSIS — R519 Headache, unspecified: Secondary | ICD-10-CM | POA: Diagnosis not present

## 2020-04-26 DIAGNOSIS — R42 Dizziness and giddiness: Secondary | ICD-10-CM | POA: Diagnosis not present

## 2020-04-26 DIAGNOSIS — I10 Essential (primary) hypertension: Secondary | ICD-10-CM | POA: Diagnosis not present

## 2020-04-30 DIAGNOSIS — Z681 Body mass index (BMI) 19 or less, adult: Secondary | ICD-10-CM | POA: Diagnosis not present

## 2020-04-30 DIAGNOSIS — J328 Other chronic sinusitis: Secondary | ICD-10-CM | POA: Diagnosis not present

## 2020-04-30 DIAGNOSIS — R42 Dizziness and giddiness: Secondary | ICD-10-CM | POA: Diagnosis not present

## 2020-05-01 DIAGNOSIS — R42 Dizziness and giddiness: Secondary | ICD-10-CM | POA: Diagnosis not present

## 2020-05-01 DIAGNOSIS — H52223 Regular astigmatism, bilateral: Secondary | ICD-10-CM | POA: Diagnosis not present

## 2020-05-01 DIAGNOSIS — H5330 Unspecified disorder of binocular vision: Secondary | ICD-10-CM | POA: Diagnosis not present

## 2020-05-02 DIAGNOSIS — R519 Headache, unspecified: Secondary | ICD-10-CM | POA: Diagnosis not present

## 2020-05-02 DIAGNOSIS — H8113 Benign paroxysmal vertigo, bilateral: Secondary | ICD-10-CM | POA: Diagnosis not present

## 2020-05-02 DIAGNOSIS — R002 Palpitations: Secondary | ICD-10-CM | POA: Diagnosis not present

## 2020-05-02 DIAGNOSIS — R202 Paresthesia of skin: Secondary | ICD-10-CM | POA: Diagnosis not present

## 2020-05-02 DIAGNOSIS — M2550 Pain in unspecified joint: Secondary | ICD-10-CM | POA: Diagnosis not present

## 2020-05-08 DIAGNOSIS — J328 Other chronic sinusitis: Secondary | ICD-10-CM | POA: Diagnosis not present

## 2020-05-14 DIAGNOSIS — R0683 Snoring: Secondary | ICD-10-CM | POA: Diagnosis not present

## 2020-05-14 DIAGNOSIS — R42 Dizziness and giddiness: Secondary | ICD-10-CM | POA: Diagnosis not present

## 2020-05-14 DIAGNOSIS — J342 Deviated nasal septum: Secondary | ICD-10-CM | POA: Diagnosis not present

## 2020-05-14 DIAGNOSIS — J3089 Other allergic rhinitis: Secondary | ICD-10-CM | POA: Diagnosis not present

## 2020-05-14 DIAGNOSIS — J328 Other chronic sinusitis: Secondary | ICD-10-CM | POA: Diagnosis not present

## 2020-05-14 DIAGNOSIS — K219 Gastro-esophageal reflux disease without esophagitis: Secondary | ICD-10-CM | POA: Diagnosis not present

## 2020-05-14 DIAGNOSIS — J0101 Acute recurrent maxillary sinusitis: Secondary | ICD-10-CM | POA: Diagnosis not present

## 2020-05-24 DIAGNOSIS — M542 Cervicalgia: Secondary | ICD-10-CM | POA: Diagnosis not present

## 2020-05-24 DIAGNOSIS — G43909 Migraine, unspecified, not intractable, without status migrainosus: Secondary | ICD-10-CM | POA: Diagnosis not present

## 2020-05-24 DIAGNOSIS — M5481 Occipital neuralgia: Secondary | ICD-10-CM | POA: Diagnosis not present

## 2020-05-24 DIAGNOSIS — M62838 Other muscle spasm: Secondary | ICD-10-CM | POA: Diagnosis not present

## 2020-06-04 DIAGNOSIS — M50323 Other cervical disc degeneration at C6-C7 level: Secondary | ICD-10-CM | POA: Diagnosis not present

## 2020-06-04 DIAGNOSIS — M62838 Other muscle spasm: Secondary | ICD-10-CM | POA: Diagnosis not present

## 2020-06-04 DIAGNOSIS — J341 Cyst and mucocele of nose and nasal sinus: Secondary | ICD-10-CM | POA: Diagnosis not present

## 2020-06-04 DIAGNOSIS — M542 Cervicalgia: Secondary | ICD-10-CM | POA: Diagnosis not present

## 2020-06-04 DIAGNOSIS — G8929 Other chronic pain: Secondary | ICD-10-CM | POA: Diagnosis not present

## 2020-06-04 DIAGNOSIS — G43909 Migraine, unspecified, not intractable, without status migrainosus: Secondary | ICD-10-CM | POA: Diagnosis not present

## 2020-06-04 DIAGNOSIS — M5481 Occipital neuralgia: Secondary | ICD-10-CM | POA: Diagnosis not present

## 2020-06-04 DIAGNOSIS — R42 Dizziness and giddiness: Secondary | ICD-10-CM | POA: Diagnosis not present

## 2020-06-04 DIAGNOSIS — M5021 Other cervical disc displacement,  high cervical region: Secondary | ICD-10-CM | POA: Diagnosis not present

## 2020-06-05 DIAGNOSIS — R42 Dizziness and giddiness: Secondary | ICD-10-CM | POA: Diagnosis not present

## 2020-06-06 DIAGNOSIS — M47812 Spondylosis without myelopathy or radiculopathy, cervical region: Secondary | ICD-10-CM | POA: Diagnosis not present

## 2020-06-06 DIAGNOSIS — M62838 Other muscle spasm: Secondary | ICD-10-CM | POA: Diagnosis not present

## 2020-06-06 DIAGNOSIS — R42 Dizziness and giddiness: Secondary | ICD-10-CM | POA: Diagnosis not present

## 2020-06-06 DIAGNOSIS — M5481 Occipital neuralgia: Secondary | ICD-10-CM | POA: Diagnosis not present

## 2020-06-06 DIAGNOSIS — G43909 Migraine, unspecified, not intractable, without status migrainosus: Secondary | ICD-10-CM | POA: Diagnosis not present

## 2020-06-11 ENCOUNTER — Ambulatory Visit: Payer: 59 | Attending: Nurse Practitioner | Admitting: Physical Therapy

## 2020-06-11 ENCOUNTER — Encounter: Payer: Self-pay | Admitting: Physical Therapy

## 2020-06-11 ENCOUNTER — Other Ambulatory Visit: Payer: Self-pay

## 2020-06-11 DIAGNOSIS — R42 Dizziness and giddiness: Secondary | ICD-10-CM | POA: Diagnosis not present

## 2020-06-11 DIAGNOSIS — R29898 Other symptoms and signs involving the musculoskeletal system: Secondary | ICD-10-CM | POA: Diagnosis not present

## 2020-06-11 DIAGNOSIS — R2681 Unsteadiness on feet: Secondary | ICD-10-CM | POA: Diagnosis not present

## 2020-06-11 DIAGNOSIS — M542 Cervicalgia: Secondary | ICD-10-CM | POA: Diagnosis not present

## 2020-06-11 NOTE — Therapy (Signed)
[facility] [address] [place], [place], [address] Phone: [phone]   Fax:  [phone]  Physical Therapy Evaluation  Patient Details  Name: Peggy Wilkerson MRN: [id number] Date of Birth: 1970-12-26 Referring Provider (PT): Leonides Cave, NP   Encounter Date: 06/11/2020   PT End of Session - 06/11/20 2108    Visit Number 1    Number of Visits 9    Date for PT Re-Evaluation 07/12/20    Authorization Type [employer]    PT Start Time 0277    PT Stop Time 1318    PT Time Calculation (min) 43 min    Activity Tolerance Patient tolerated treatment well    Behavior During Therapy [facility] for tasks assessed/performed           Past Medical History:  Diagnosis Date  . GERD (gastroesophageal reflux disease)   . Hypertension     Past Surgical History:  Procedure Laterality Date  . CHOLECYSTECTOMY      There were no vitals filed for this visit.    Subjective Assessment - 06/11/20 1241    Subjective Pt presents to PT eval with c/o constant light-headedness and dizziness - states her ENT thinks it is an inflammatory response to [employer] vaccine (received mid-June 2021); pt describes it initially started as a severe spinning sensation but is now a light-headedness which is constant but varies in intensity; states she just started driving last Wed. - was out of work for 5 weeks due to the dizziness (worked remotely)  Pt states she has pain and tightness behind her Rt ear, shooting pain at times.  Referral lists migraines, dizziness, and spondylosis of cervical region (without myelopathy or radiculopathy). Pt doesn't think dizziness is due to migraines.   occasional tinittus   Pertinent History HTN, GERD    Diagnostic tests CT scan - for sinuses - showed polyp    Patient Stated Goals resolve the dizziness    Currently in Pain? Yes    Pain Score 10-Worst pain ever    Pain Location Neck    Pain Orientation Right    Pain  Descriptors / Indicators Tightness    Pain Type Chronic pain    Pain Onset More than a month ago    Pain Frequency Constant    Aggravating Factors  straining thru shoulders    Pain Relieving Factors none - nothing helps              [facility] PT Assessment - 06/11/20 1250      Assessment   Medical Diagnosis Dizziness: Migraines; Spondylosis of cervical region    Referring Provider (PT) Leonides Cave, NP    Onset Date/Surgical Date --   mid June 2021   Prior Therapy none      Precautions   Precautions Fall;Other (comment)   dizziness     Restrictions   Weight Bearing Restrictions No      Balance Screen   Has the patient fallen in the past 6 months Yes    How many times? 3    Has the patient had a decrease in activity level because of a fear of falling?  Yes    Is the patient reluctant to leave their home because of a fear of falling?  No      Prior Function   Level of Independence Independent    Vocation Full time employment    Vocation Requirements pt works in radiology at [employer]  Observation/Other Assessments   Focus on Therapeutic Outcomes (FOTO)  pt's FS measure 56/100 with risk adjusted 59/100 (Arthur not completed by pt)      Ambulation/Gait   Ambulation/Gait Yes    Ambulation/Gait Assistance 6: Modified independent (Device/Increase time)    Ambulation Distance (Feet) 100 Feet    Assistive device None    Gait Pattern Within Functional Limits    Ambulation Surface Level;Indoor                  Vestibular Assessment - 06/11/20 0001      Vestibular Assessment   General Observation Pt is a 49 yr old lady with c/o light-headedness - amb. independently       Symptom Behavior   Subjective history of current problem pt reports dizziness started after she received the [employer] vaccine in June 2021; initially thought she had a sinus infection; was prescrbed Prednisone which helped but states she now has constant light-headedness which has improved from the  initial spinning sensation at onset but states she "is not right"; pt becomes visibly upset when describing her current symptoms and status     Type of Dizziness  Blurred vision;Lightheadedness;Spinning;Unsteady with head/body turns;Imbalance    Frequency of Dizziness daily    Duration of Dizziness mostly constant    Symptom Nature Constant;Motion provoked;Variable    Aggravating Factors No known aggravating factors    Relieving Factors Lying supine;Head stationary;Closing eyes    Progression of Symptoms Better   only slightly better     Oculomotor Exam   Oculomotor Alignment Normal    Spontaneous Absent    Gaze-induced  Absent    Smooth Pursuits Intact    Saccades Intact    Comment pt states smooth pursuit testing and saccades were not pleasant (with testing) but "I could do it"      Oculomotor Exam-Fixation Suppressed    Ocular Alignment normal      Visual Acuity   Static line 10    Dynamic line 9   no major c/o dizziness when testing completed     Positional Testing   Sidelying Test Sidelying Right;Sidelying Left      Sidelying Right   Sidelying Right Duration no increase    Sidelying Right Symptoms No nystagmus      Sidelying Left   Sidelying Left Duration no increase in symptoms    Sidelying Left Symptoms No nystagmus      Positional Sensitivities   Sit to Supine Lightheadedness    Nose to Left Knee Moderate dizziness    Left Knee to Sitting Moderate dizziness    Head Turning x 5 Moderate dizziness    Head Nodding x 5 Moderate dizziness              Objective measurements completed on examination: See above findings.               PT Education - 06/11/20 2106    Education Details instructed pt to try to increase activity/mobility to tolerance - i.e. walk, move around as much as tolerated    Person(s) Educated Patient    Methods Explanation    Comprehension Verbalized understanding            PT Short Term Goals - 06/11/20 2119      PT  SHORT TERM GOAL #1   Title same as LTG's             PT Long Term Goals - 06/11/20 2119      PT  LONG TERM GOAL #1   Title Pt will improve DHI score by at least 20 points to demo improvement in vertigo.    Time 4    Period Weeks    Status New    Target Date 07/12/20      PT LONG TERM GOAL #2   Title Pt will amb. 60' with horizontal head turns with c/o dizziness </= 3/10 intensity.    Time 4    Period Weeks    Status New    Target Date 07/12/20      PT LONG TERM GOAL #3   Title Complete SOT and establish LTG as appropriate.    Time 4    Period Weeks    Status New    Target Date 07/12/20      PT LONG TERM GOAL #4   Title Improve FGA score by at least 6 points to demo improved balance and safety with gait .    Time 4    Period Weeks    Status New    Target Date 07/12/20      PT LONG TERM GOAL #5   Title Independent in HEP for balance and vestibular exercises, and walking program.    Time 4    Period Weeks    Status New    Target Date 07/12/20                  Plan - 06/11/20 2109    Clinical Impression Statement Pt is a 49 yr old lady with c/o constant dizziness/light-headedness and imbalance which started after she received the initial dose of Pfizer vaccine in June 2021.  Exact etiology of dizziness is unknown with differential diagnoses include migraine and cervical spondylosis.  Pt's DVA is WNL's with a 1 line difference, indicative of normal VOR function.  No nystagmus was noted during any positional testing or oculomotor assessment.  Pt c/o fullness in her ears and occasional tinnitus but denies hearing loss.  Pt will benefit from dry needling to address Rt posterior and suboccipital pain/muscle tightness and vestibular exercises to address dizziness.    Personal Factors and Comorbidities Comorbidity 1    Comorbidities GERD, HTN    Examination-Activity Limitations Squat;Locomotion Level;Stand;Other;Bend   working, driving   Examination-Participation  Restrictions Driving;Community Activity;Cleaning;Meal Prep;Interpersonal Relationship;Laundry;Occupation    Stability/Clinical Decision Making Stable/Uncomplicated    Clinical Decision Making Low    Rehab Potential Good    PT Frequency 2x / week    PT Duration 4 weeks    PT Treatment/Interventions ADLs/Self Care Home Management;Vestibular;Balance training;Neuromuscular re-education;Patient/family education;Therapeutic activities;Therapeutic exercise;Gait training    PT Next Visit Plan Audra, please check BP as I did not do this at eval and please give [place] for her to complete as this was not done as part of FOTO:  dry needling in your sessions and I will do vestibular exercises when I see her    Consulted and Agree with Plan of Care Patient           Patient will benefit from skilled therapeutic intervention in order to improve the following deficits and impairments:  Dizziness, Decreased balance, Difficulty walking, Decreased activity tolerance  Visit Diagnosis: Cervicalgia - Plan: PT plan of care cert/re-cert  Dizziness and giddiness - Plan: PT plan of care cert/re-cert  Unsteadiness on feet - Plan: PT plan of care cert/re-cert  Other symptoms and signs involving the musculoskeletal system - Plan: PT plan of care cert/re-cert     Problem List There are no  problems to display for this patient.   Alda Lea, PT 06/11/2020, 9:27 PM  [facility] [address] [place], [place], [address] Phone: [phone]   Fax:  [phone]  Name: Letisha Yera MRN: [id number] Date of Birth: Mar 04, 1971

## 2020-06-20 ENCOUNTER — Encounter: Payer: Self-pay | Admitting: Physical Therapy

## 2020-06-20 ENCOUNTER — Other Ambulatory Visit: Payer: Self-pay

## 2020-06-20 ENCOUNTER — Ambulatory Visit: Payer: 59 | Admitting: Physical Therapy

## 2020-06-20 DIAGNOSIS — R42 Dizziness and giddiness: Secondary | ICD-10-CM

## 2020-06-20 DIAGNOSIS — M542 Cervicalgia: Secondary | ICD-10-CM

## 2020-06-20 DIAGNOSIS — R2681 Unsteadiness on feet: Secondary | ICD-10-CM | POA: Diagnosis not present

## 2020-06-20 DIAGNOSIS — R29898 Other symptoms and signs involving the musculoskeletal system: Secondary | ICD-10-CM | POA: Diagnosis not present

## 2020-06-20 NOTE — Therapy (Signed)
[facility] [address] [place], [place], [address] Phone: [phone]   Fax:  [phone]  Physical Therapy Treatment  Patient Details  Name: Peggy Wilkerson MRN: [id number] Date of Birth: [birth year] Referring Provider (PT): Leonides Cave, NP   Encounter Date: 06/20/2020   PT End of Session - 06/20/20 0955    Visit Number 2    Number of Visits 9    Date for PT Re-Evaluation 07/12/20    Authorization Type [employer]    PT Start Time 0800    PT Stop Time 0850    PT Time Calculation (min) 50 min    Equipment Utilized During Treatment Other (comment)   dry needles   Activity Tolerance Patient tolerated treatment well    Behavior During Therapy [facility] for tasks assessed/performed           Past Medical History:  Diagnosis Date  . GERD (gastroesophageal reflux disease)   . Hypertension     Past Surgical History:  Procedure Laterality Date  . CHOLECYSTECTOMY      There were no vitals filed for this visit.   Subjective Assessment - 06/20/20 0810    Subjective No significant changes since eval.  Symptoms are still "terrible."  Pt reports neck is always tight.  Pt reports carrying tension in neck and shoulders.  Symptoms of dizziness/off balance, pressure in ears get worse after prolonged sitting at the computer; this is new since onset of dizziness.   occasional tinittus   Pertinent History HTN, GERD    Diagnostic tests CT scan - for sinuses - showed polyp    Patient Stated Goals resolve the dizziness    Currently in Pain? Yes    Pain Location Head    Pain Descriptors / Indicators Other (Comment)   foggy   Pain Type Chronic pain    Pain Onset More than a month ago              [facility] PT Assessment - 06/20/20 0814      ROM / Strength   AROM / PROM / Strength AROM      AROM   Overall AROM  Deficits    AROM Assessment Site Cervical    Cervical Flexion 40   tension   Cervical Extension 30   intermittent  dizziness looking up   Cervical - Right Side Bend 40   tight on R   Cervical - Left Side Bend 40    Cervical - Right Rotation 65    Cervical - Left Rotation 50      Palpation   Palpation comment tenderness to palpation and trigger points noted in R suboccipital muscles and bilateral upper trapezius muscles                         [facility] Adult PT Treatment/Exercise - 06/20/20 0951      Therapeutic Activites    Therapeutic Activities Other Therapeutic Activities    Other Therapeutic Activities Assessed Cervical ROM and assessed for trigger points.  Provided pt with hand out about dry needling.  Assessed for precautions and contraindications with none found.  Educated on possible side effects of dry needling and ways to mitigate side effects.  Educated pt on purpose of dry needling to address neck tension that has increased with dizziness and guarding of head and neck movement.  Explained that dry needling may not change or affect foggy, woozy feeling but would allow for increased  ROM for vestibular exercises      Exercises   Exercises Other Exercises    Other Exercises  Educated on how to perform suboccipital stretches in sitting and use of tennis balls for suboccipital massage.  Pt return demonstrated seated suboccipital stretch stabilizing lower cervical spine with chin tuck.              Trigger Point Dry Needling - 06/20/20 0954    Consent Given? Yes    Education Handout Provided Yes    Muscles Treated Head and Neck Suboccipitals    Dry Needling Comments Performed in prone to R side only; pt reporting mild dizziness with suboccipital stimulation and twitch response    Suboccipitals Response Twitch response elicited;Palpable increased muscle length                PT Education - 06/20/20 0950    Education Details educated on trigger point dry needling, ways to manage suboccipital tension.    Person(s) Educated Patient    Methods  Explanation;Demonstration;Handout    Comprehension Verbalized understanding            PT Short Term Goals - 06/11/20 2119      PT SHORT TERM GOAL #1   Title same as LTG's             PT Long Term Goals - 06/11/20 2119      PT LONG TERM GOAL #1   Title Pt will improve DHI score by at least 20 points to demo improvement in vertigo.    Time 4    Period Weeks    Status New    Target Date 07/12/20      PT LONG TERM GOAL #2   Title Pt will amb. 55' with horizontal head turns with c/o dizziness </= 3/10 intensity.    Time 4    Period Weeks    Status New    Target Date 07/12/20      PT LONG TERM GOAL #3   Title Complete SOT and establish LTG as appropriate.    Time 4    Period Weeks    Status New    Target Date 07/12/20      PT LONG TERM GOAL #4   Title Improve FGA score by at least 6 points to demo improved balance and safety with gait .    Time 4    Period Weeks    Status New    Target Date 07/12/20      PT LONG TERM GOAL #5   Title Independent in HEP for balance and vestibular exercises, and walking program.    Time 4    Period Weeks    Status New    Target Date 07/12/20                 Plan - 06/20/20 0956    Clinical Impression Statement Provided pt with extensive education regarding purpose and mechanism of dry needling, side effects and ways to mitigate side effects.  Assessed ROM with increased tension and trigger points found on R side.  Performed TDN of suboccipital muscles and instructed pt in ways to address suboccipital tension. Pt did experience some dizziness with twitch response in suboccipitals on R side.  Will continue to address neck tension in upper trapezius tomorrow.    Personal Factors and Comorbidities Comorbidity 1    Comorbidities GERD, HTN    Examination-Activity Limitations Squat;Locomotion Level;Stand;Other;Bend   working, driving   Examination-Participation Restrictions Driving;Community Activity;Cleaning;Meal  Prep;Interpersonal Relationship;Laundry;Occupation    Stability/Clinical Decision Making Stable/Uncomplicated    Rehab Potential Good    PT Frequency 2x / week    PT Duration 4 weeks    PT Treatment/Interventions ADLs/Self Care Home Management;Vestibular;Balance training;Neuromuscular re-education;Patient/family education;Therapeutic activities;Therapeutic exercise;Gait training    PT Next Visit Plan DHI, BP.  TDN to upper trap and provide exercises    Consulted and Agree with Plan of Care Patient           Patient will benefit from skilled therapeutic intervention in order to improve the following deficits and impairments:  Dizziness, Decreased balance, Difficulty walking, Decreased activity tolerance  Visit Diagnosis: Cervicalgia  Dizziness and giddiness     Problem List There are no problems to display for this patient.   Rico Junker, PT, DPT 06/20/20    10:04 AM    [facility] [address] [place], [place], [address] Phone: [phone]   Fax:  [phone]  Name: Peggy Wilkerson MRN: [id number] Date of Birth: December 18, 1970

## 2020-06-20 NOTE — Patient Instructions (Addendum)
Trigger Point Dry Needling  . What is Trigger Point Dry Needling (DN)? o DN is a physical therapy technique used to treat muscle pain and dysfunction. Specifically, DN helps deactivate muscle trigger points (muscle knots).  o A thin filiform needle is used to penetrate the skin and stimulate the underlying trigger point. The goal is for a local twitch response (LTR) to occur and for the trigger point to relax. No medication of any kind is injected during the procedure.   . What Does Trigger Point Dry Needling Feel Like?  o The procedure feels different for each individual patient. Some patients report that they do not actually feel the needle enter the skin and overall the process is not painful. Very mild bleeding may occur. However, many patients feel a deep cramping in the muscle in which the needle was inserted. This is the local twitch response.   Marland Kitchen How Will I feel after the treatment? o Soreness is normal, and the onset of soreness may not occur for a few hours. Typically this soreness does not last longer than two days.  o Bruising is uncommon, however; ice can be used to decrease any possible bruising.  o In rare cases feeling tired or nauseous after the treatment is normal. In addition, your symptoms may get worse before they get better, this period will typically not last longer than 24 hours.   . What Can I do After My Treatment? o Increase your hydration by drinking more water for the next 24 hours. o You may place ice or heat on the areas treated that have become sore, however, do not use heat on inflamed or bruised areas. Heat often brings more relief post needling. o You can continue your regular activities, but vigorous activity is not recommended initially after the treatment for 24 hours. o DN is best combined with other physical therapy such as strengthening, stretching, and other therapies    Suboccipital Stretch (Supine)    Place two tennis balls in a sock and place at  base of skull.  Nod head up and down and turn side to side to massage muscles at base of skull with tennis balls when you feel increased tension.   Neck: Chin Tuck Stretch    Sit with back straight. Place hands, with fingers locked, on back of neck to stabilize neck.  Tuck chin down towards chest moving upper neck only until you feel a slight stretch at base of skull.  Hold for 30 seconds and breathe.  Repeat 3 times.

## 2020-06-21 ENCOUNTER — Ambulatory Visit: Payer: 59 | Admitting: Physical Therapy

## 2020-06-21 ENCOUNTER — Encounter: Payer: Self-pay | Admitting: Physical Therapy

## 2020-06-21 DIAGNOSIS — R29898 Other symptoms and signs involving the musculoskeletal system: Secondary | ICD-10-CM | POA: Diagnosis not present

## 2020-06-21 DIAGNOSIS — G43909 Migraine, unspecified, not intractable, without status migrainosus: Secondary | ICD-10-CM | POA: Diagnosis not present

## 2020-06-21 DIAGNOSIS — M542 Cervicalgia: Secondary | ICD-10-CM | POA: Diagnosis not present

## 2020-06-21 DIAGNOSIS — R42 Dizziness and giddiness: Secondary | ICD-10-CM | POA: Diagnosis not present

## 2020-06-21 DIAGNOSIS — M47812 Spondylosis without myelopathy or radiculopathy, cervical region: Secondary | ICD-10-CM | POA: Diagnosis not present

## 2020-06-21 DIAGNOSIS — M5481 Occipital neuralgia: Secondary | ICD-10-CM | POA: Diagnosis not present

## 2020-06-21 DIAGNOSIS — R2681 Unsteadiness on feet: Secondary | ICD-10-CM | POA: Diagnosis not present

## 2020-06-21 NOTE — Therapy (Addendum)
[facility] [address] [place], [place], [address] Phone: [phone]   Fax:  [phone]  Physical Therapy Treatment  Patient Details  Name: Peggy Wilkerson MRN: [id number] Date of Birth: 1971/06/10 Referring Provider (PT): Leonides Cave, NP   Encounter Date: 06/21/2020   PT End of Session - 06/21/20 1639    Visit Number 3    Number of Visits 9    Date for PT Re-Evaluation 07/12/20    Authorization Type [employer]    PT Start Time 1540    PT Stop Time 1625    PT Time Calculation (min) 45 min    Equipment Utilized During Treatment Other (comment)   dry needles   Activity Tolerance Patient tolerated treatment well    Behavior During Therapy [facility] for tasks assessed/performed           Past Medical History:  Diagnosis Date  . GERD (gastroesophageal reflux disease)   . Hypertension     Past Surgical History:  Procedure Laterality Date  . CHOLECYSTECTOMY      There were no vitals filed for this visit.   Subjective Assessment - 06/21/20 1542    Subjective Felt like the dry needling relieved some pressure on R side.  Still having aural fullness and sinus pressure.  Had an episode of dizziness today at work - head feels full, pressure, like she was going to pass out.   occasional tinittus   Pertinent History HTN, GERD    Diagnostic tests CT scan - for sinuses - showed polyp    Patient Stated Goals resolve the dizziness    Currently in Pain? Yes    Pain Score 5     Pain Location Head    Pain Orientation Right;Posterior    Pain Descriptors / Indicators Pressure    Pain Onset More than a month ago                             [facility] Adult PT Treatment/Exercise - 06/22/20 1800      Exercises   Exercises Neck      Neck Exercises: Supine   Neck Retraction 10 reps;5 secs    Neck Retraction Limitations in supine after TDN and manual therapy      Manual Therapy   Manual Therapy Myofascial  release;Manual Traction    Manual therapy comments performed in supine after TDN    Myofascial Release suboccipital release >5 minutes in supine after TDN    Manual Traction combined with suboccipital release and neck retraction/chin tucks      Neck Exercises: Stretches   Upper Trapezius Stretch Right;Left;1 rep;30 seconds    Chest Stretch 2 reps;30 seconds    Chest Stretch Limitations seated, interlocking hands behind body and pulling shoulders back    Other Neck Stretches Performed posterior shoulder rotations x 10 reps    Other Neck Stretches Stabilized lower cervical spine performing chin tucks for upper cervical/suboccipital stretch            Trigger Point Dry Needling - 06/21/20 1546    Consent Given? Yes    Education Handout Provided Previously provided    Muscles Treated Head and Neck Upper trapezius    Dry Needling Comments Performed in supine on both R and L side    Upper Trapezius Response Twitch reponse elicited;Palpable increased muscle length                PT  Education - 06/21/20 1639    Education Details cervical spine and shoulder HEP provided, advised to take breaks throughout the day to stretch, walk, drink water, etc.    Person(s) Educated Patient    Methods Explanation;Demonstration;Handout    Comprehension Verbalized understanding;Returned demonstration          Suboccipital Stretch (Supine)    Place two tennis balls in a sock and place at base of skull.  Nod head up and down and turn side to side to massage muscles at base of skull with tennis balls when you feel increased tension.   Neck: Chin Tuck Stretch    Sit with back straight. Place hands, with fingers locked, on back of neck to stabilize neck.  Tuck chin down towards chest moving upper neck only until you feel a slight stretch at base of skull.  Hold for 30 seconds and breathe.  Repeat 3 times.  Can add a chin tuck for an extra release    Flexibility: Upper Trapezius  Stretch    Gently grasp right side of head while reaching behind back with other hand. Tilt head away until a gentle stretch is felt. Hold __30__ seconds. Repeat ___2_ times per set.     Shoulder    Reach behind and either hold opposite wrists or interlace fingers. Pull shoulders down and back. Hold for _30__ seconds. Repeat __2_ times. Do _2-3__ times per day.   Shoulder Roll    Move shoulders forward, up, back, then down. Continue circling shoulders backward _10__ times.   Perform 2-3 times.            PT Short Term Goals - 06/11/20 2119      PT SHORT TERM GOAL #1   Title same as LTG's             PT Long Term Goals - 06/11/20 2119      PT LONG TERM GOAL #1   Title Pt will improve DHI score by at least 20 points to demo improvement in vertigo.    Time 4    Period Weeks    Status New    Target Date 07/12/20      PT LONG TERM GOAL #2   Title Pt will amb. 64' with horizontal head turns with c/o dizziness </= 3/10 intensity.    Time 4    Period Weeks    Status New    Target Date 07/12/20      PT LONG TERM GOAL #3   Title Complete SOT and establish LTG as appropriate.    Time 4    Period Weeks    Status New    Target Date 07/12/20      PT LONG TERM GOAL #4   Title Improve FGA score by at least 6 points to demo improved balance and safety with gait .    Time 4    Period Weeks    Status New    Target Date 07/12/20      PT LONG TERM GOAL #5   Title Independent in HEP for balance and vestibular exercises, and walking program.    Time 4    Period Weeks    Status New    Target Date 07/12/20                 Plan - 06/22/20 1806    Clinical Impression Statement Pt reporting good results from TDN of suboccipital muscles yesterday.  Pt agreeable to performing TDN of bilat upper trapezius  muscles followed by manual therapy.  Instructed pt in cervical/shoulder HEP and recommended that pt begin to take breaks at work and perform exercises 2x/day  at work to manage neck and shoulder tension at work.    Personal Factors and Comorbidities Comorbidity 1    Comorbidities GERD, HTN    Examination-Activity Limitations Squat;Locomotion Level;Stand;Other;Bend   working, driving   Examination-Participation Restrictions Driving;Community Activity;Cleaning;Meal Prep;Interpersonal Relationship;Laundry;Occupation    Stability/Clinical Decision Making Stable/Uncomplicated    Rehab Potential Good    PT Frequency 2x / week    PT Duration 4 weeks    PT Treatment/Interventions ADLs/Self Care Home Management;Vestibular;Balance training;Neuromuscular re-education;Patient/family education;Therapeutic activities;Therapeutic exercise;Gait training    PT Next Visit Plan Markus Jarvis, I didn't get a chance to do [place].  Please give and please check BP.  I have started a neck HEP.  Please initiate vestibular HEP.    Consulted and Agree with Plan of Care Patient           Patient will benefit from skilled therapeutic intervention in order to improve the following deficits and impairments:  Dizziness, Decreased balance, Difficulty walking, Decreased activity tolerance  Visit Diagnosis: Cervicalgia  Dizziness and giddiness     Problem List There are no problems to display for this patient.   Rico Junker, PT, DPT 06/22/20    6:10 PM    [facility] [address] [place], [place], [address] Phone: [phone]   Fax:  [phone]  Name: Peggy Wilkerson MRN: [id number] Date of Birth: 1971/03/13

## 2020-06-21 NOTE — Patient Instructions (Signed)
Suboccipital Stretch (Supine)    Place two tennis balls in a sock and place at base of skull.  Nod head up and down and turn side to side to massage muscles at base of skull with tennis balls when you feel increased tension.   Neck: Chin Tuck Stretch    Sit with back straight. Place hands, with fingers locked, on back of neck to stabilize neck.  Tuck chin down towards chest moving upper neck only until you feel a slight stretch at base of skull.  Hold for 30 seconds and breathe.  Repeat 3 times.  Can add a chin tuck for an extra release    Flexibility: Upper Trapezius Stretch    Gently grasp right side of head while reaching behind back with other hand. Tilt head away until a gentle stretch is felt. Hold __30__ seconds. Repeat ___2_ times per set.     Shoulder    Reach behind and either hold opposite wrists or interlace fingers. Pull shoulders down and back. Hold for _30__ seconds. Repeat __2_ times. Do _2-3__ times per day.   Shoulder Roll    Move shoulders forward, up, back, then down. Continue circling shoulders backward _10__ times.   Perform 2-3 times.

## 2020-06-25 ENCOUNTER — Other Ambulatory Visit: Payer: Self-pay

## 2020-06-25 ENCOUNTER — Ambulatory Visit: Payer: 59 | Admitting: Physical Therapy

## 2020-06-25 VITALS — BP 143/105 | HR 93

## 2020-06-25 DIAGNOSIS — R2681 Unsteadiness on feet: Secondary | ICD-10-CM

## 2020-06-25 DIAGNOSIS — M542 Cervicalgia: Secondary | ICD-10-CM

## 2020-06-25 DIAGNOSIS — R42 Dizziness and giddiness: Secondary | ICD-10-CM

## 2020-06-25 DIAGNOSIS — R29898 Other symptoms and signs involving the musculoskeletal system: Secondary | ICD-10-CM | POA: Diagnosis not present

## 2020-06-26 ENCOUNTER — Ambulatory Visit: Payer: Self-pay | Admitting: Family Medicine

## 2020-06-26 ENCOUNTER — Encounter: Payer: Self-pay | Admitting: Physical Therapy

## 2020-06-26 NOTE — Progress Notes (Deleted)
Peggy Wilkerson DOB: 09-Jun-1971 Encounter date: 06/26/2020  This isa 49 y.o. female who presents to establish care. No chief complaint on file.   History of present illness:  ***   Past Medical History:  Diagnosis Date   GERD (gastroesophageal reflux disease)    Hypertension    Past Surgical History:  Procedure Laterality Date   CHOLECYSTECTOMY     Allergies  Allergen Reactions   Doxycycline Other (See Comments)    Rapid HR Rapid HR    Minocycline Other (See Comments)    Rapid HR Rapid HR    No outpatient medications have been marked as taking for the 06/26/20 encounter (Appointment) with Caren Macadam, MD.   Social History   Tobacco Use   Smoking status: Never Smoker   Smokeless tobacco: Never Used  Substance Use Topics   Alcohol use: Never   Family History  Problem Relation Age of Onset   Cancer Father      Review of Systems  Objective:  There were no vitals taken for this visit.      BP Readings from Last 3 Encounters:  06/25/20 (!) 143/105  04/04/20 (!) 145/94  05/16/18 127/78   Wt Readings from Last 3 Encounters:  04/04/20 220 lb (99.8 kg)    Physical Exam  Assessment/Plan:  There are no diagnoses linked to this encounter.  No follow-ups on file.  Micheline Rough, MD

## 2020-06-26 NOTE — Therapy (Signed)
[facility] [address] [place], [place], [address] Phone: [phone]   Fax:  [phone]  Physical Therapy Treatment  Patient Details  Name: Peggy Wilkerson MRN: [id number] Date of Birth: 09-27-1971 Referring Provider (PT): Leonides Cave, NP   Encounter Date: 06/25/2020   PT End of Session - 06/26/20 1215    Visit Number 4    Number of Visits 9    Date for PT Re-Evaluation 07/12/20    Authorization Type [employer]    PT Start Time 1020    PT Stop Time 1103    PT Time Calculation (min) 43 min    Equipment Utilized During Treatment Other (comment)   HARNESS USED WITH SOT   Activity Tolerance Patient tolerated treatment well    Behavior During Therapy [facility] for tasks assessed/performed           Past Medical History:  Diagnosis Date  . GERD (gastroesophageal reflux disease)   . Hypertension     Past Surgical History:  Procedure Laterality Date  . CHOLECYSTECTOMY      Vitals:   06/25/20 1032  BP: (!) 143/105  Pulse: 93   above BP taken with automatic machine - LUE    Subjective Assessment - 06/26/20 1203    Subjective Pt states she has felt so much better since receiving the dry needling treatments - was able to go out on the boat on Sunday for a couple of hours and did fine.  Reports mild unsteadiness at times.   occasional tinittus   Pertinent History HTN, GERD    Diagnostic tests CT scan - for sinuses - showed polyp    Patient Stated Goals resolve the dizziness    Currently in Pain? Yes    Pain Score 3     Pain Location Head    Pain Orientation Right;Posterior    Pain Descriptors / Indicators Tightness;Pressure;Discomfort    Pain Type Chronic pain    Pain Onset More than a month ago    Pain Frequency Constant             Sensory Organization Test - composite score 65/100 with N=70/100  Computer scored somatosensory, visual and vestibular inputs all WNL's even though almost all  scores were  slightly  decreased from N  Condition 1 - all 3 trials WNL's Condition 2 - trials 1 and 2 below N - trial 3 WNL's Condition 3 - all 3 trials below N Condition 4 - trial 1 WNL's; trials 2 & 3 below N Condition 5 - trials 1 & 3 below N: trial 2 WNL's Condition 6 - trial 1 WNL's; trials 2 and 3 below N  Pt instructed in balance on foam for HEP - feet together with EO and EC with head turns horizontally and vertically 5 reps each   Pt performed Mulligan's stretch with towel to Lt side 20 sec hold for Rt cervical rotator stretch              [facility] Adult PT Treatment/Exercise - 06/26/20 0001      Neck Exercises: Seated   Cervical Rotation Left;Other (comment)   2 reps - Mulligan's stretch with towel - 20 sec hold               Balance Exercises - 06/26/20 0001      Balance Exercises: Standing   Standing Eyes Opened Narrow base of support (BOS);Wide (BOA);Head turns;Foam/compliant surface;5 reps   head turns side to side and up/down  5 reps each   Standing Eyes Closed Narrow base of support (BOS);Wide (BOA);Head turns;Foam/compliant surface;5 reps   head turns horizontally & vertically 5 reps each            PT Education - 06/26/20 1210    Education Details Pt instructed in Mulligan's stretch for Rt cervical rotators with towel and in standing on pillow with feet together with EO and EC with head turns side to side and up/down: [facility]  [id number]    Person(s) Educated Patient    Methods Explanation;Demonstration;Handout    Comprehension Verbalized understanding;Returned demonstration            PT Short Term Goals - 06/26/20 1224      PT SHORT TERM GOAL #1   Title same as LTG's             PT Long Term Goals - 06/26/20 1224      PT LONG TERM GOAL #1   Title Pt will improve DHI score by at least 20 points to demo improvement in vertigo.    Time 4    Period Weeks    Status New      PT LONG TERM GOAL #2   Title Pt will amb. 33' with horizontal head  turns with c/o dizziness </= 3/10 intensity.    Time 4    Period Weeks    Status New      PT LONG TERM GOAL #3   Title Complete SOT and establish LTG as appropriate.    Time 4    Period Weeks    Status New      PT LONG TERM GOAL #4   Title Improve FGA score by at least 6 points to demo improved balance and safety with gait .    Time 4    Period Weeks    Status New      PT LONG TERM GOAL #5   Title Independent in HEP for balance and vestibular exercises, and walking program.    Time 4    Period Weeks    Status New                 Plan - 06/26/20 1216    Clinical Impression Statement Pt's SOT composite score slightly below N of 70/100 with her score 65/100;  pt's scores on all 6 test conditions for majority of trials slightly decreased below N for her age population, however, computerized scores of somatosensory, visual and vestibular inputs all WNL's per test results.  Results appear somewhat conflicting as pt only had 4 trials out of 18 to be WNL's.  Pt reported only very slight increase in symptoms when test completed, after stepping down from machine.  Issued balance on foam for HEP to increase visual &  vestibular input in maintaining balance.  Pt tolerated mulligan's stretch with towel for Rt cervical rotators well, stating this "stretch feels really good".  Pt progressing well towards goals - cont with POC.    Personal Factors and Comorbidities Comorbidity 1    Comorbidities GERD, HTN    Examination-Activity Limitations Squat;Locomotion Level;Stand;Other;Bend   working, driving   Examination-Participation Restrictions Driving;Community Activity;Cleaning;Meal Prep;Interpersonal Relationship;Laundry;Occupation    Stability/Clinical Decision Making Stable/Uncomplicated    Rehab Potential Good    PT Frequency 2x / week    PT Duration 4 weeks    PT Treatment/Interventions ADLs/Self Care Home Management;Vestibular;Balance training;Neuromuscular re-education;Patient/family  education;Therapeutic activities;Therapeutic exercise;Gait training    PT Next Visit Plan Letta Moynahan -please give copy of  [place] as I ran out of time on Tues (didn't forget - I just didn't have time as Balance Master would not boot up); Cont with DN - I gave vestibular HEP and also Mulligan's stretch for Rt cervical rotators    PT Home Exercise Plan [facility] [id number]    Consulted and Agree with Plan of Care Patient           Patient will benefit from skilled therapeutic intervention in order to improve the following deficits and impairments:  Dizziness, Decreased balance, Difficulty walking, Decreased activity tolerance  Visit Diagnosis: Dizziness and giddiness  Cervicalgia  Unsteadiness on feet     Problem List There are no problems to display for this patient.   Alda Lea, PT 06/26/2020, 12:25 PM  [facility] [address] [place], [place], [address] Phone: [phone]   Fax:  [phone]  Name: Peggy Wilkerson MRN: [id number] Date of Birth: 08-Aug-1971

## 2020-06-26 NOTE — Patient Instructions (Addendum)
Feet Together (Compliant Surface) Varied Arm Positions - Eyes Open    With eyes open, standing on compliant surface: __pillows______, feet together and arms out, look at a stationary object. Hold _30___ seconds. Repeat _1___ times per session. Do __1-2__ sessions per day.  ADD HORIZONTAL AND VERTICAL HEAD TURNS - 5 REPS EACH - FEET APART AND TOGETHER    Feet Apart AND FEET TOGETHER  (Compliant Surface) Head Motion - Eyes Closed    Stand on compliant surface: _pillows_______ with feet shoulder width apart. Close eyes and move head slowly, up and down. Repeat __5-10__ times per session. Do _1___ sessions per day.

## 2020-06-27 ENCOUNTER — Encounter: Payer: Self-pay | Admitting: Physical Therapy

## 2020-06-27 ENCOUNTER — Ambulatory Visit: Payer: 59 | Admitting: Physical Therapy

## 2020-06-27 ENCOUNTER — Other Ambulatory Visit: Payer: Self-pay

## 2020-06-27 DIAGNOSIS — R42 Dizziness and giddiness: Secondary | ICD-10-CM

## 2020-06-27 DIAGNOSIS — R2681 Unsteadiness on feet: Secondary | ICD-10-CM | POA: Diagnosis not present

## 2020-06-27 DIAGNOSIS — R29898 Other symptoms and signs involving the musculoskeletal system: Secondary | ICD-10-CM | POA: Diagnosis not present

## 2020-06-27 DIAGNOSIS — M542 Cervicalgia: Secondary | ICD-10-CM | POA: Diagnosis not present

## 2020-06-27 NOTE — Therapy (Signed)
[facility] [address] [place], [place], [address] Phone: [phone]   Fax:  [phone]  Physical Therapy Treatment  Patient Details  Name: Ranae Casebier MRN: [id number] Date of Birth: [birth year] Referring Provider (PT): Leonides Cave, NP   Encounter Date: 06/27/2020   PT End of Session - 06/27/20 1127    Visit Number 5    Number of Visits 9    Date for PT Re-Evaluation 07/12/20    Authorization Type [employer] [facility]    PT Start Time 4008    PT Stop Time 0850    PT Time Calculation (min) 42 min    Equipment Utilized During Treatment Other (comment)   Dry Needles   Activity Tolerance Patient tolerated treatment well    Behavior During Therapy [facility] for tasks assessed/performed           Past Medical History:  Diagnosis Date  . GERD (gastroesophageal reflux disease)   . Hypertension     Past Surgical History:  Procedure Laterality Date  . CHOLECYSTECTOMY      There were no vitals filed for this visit.   Subjective Assessment - 06/27/20 0811    Subjective Pt reports feeling more like herself after the soreness wore off on Saturday.  Still a little tightness on R side.  Asking about massage therapy to maintain these results.   occasional tinittus   Pertinent History HTN, GERD    Diagnostic tests CT scan - for sinuses - showed polyp    Patient Stated Goals resolve the dizziness    Currently in Pain? Yes    Pain Onset More than a month ago                             [facility] Adult PT Treatment/Exercise - 06/27/20 1122      Therapeutic Activites    Therapeutic Activities Other Therapeutic Activities    Other Therapeutic Activities Pt asking about massage therapists in order to maintain gains made in therapy.  Provided pt with name and contact of various massage therapists that performs mysofascial release and cranialsacral massage in [place]; also encouraged pt to check with [employer]  for other providers she can contact.        Manual Therapy   Manual Therapy Myofascial release;Joint mobilization;Manual Traction    Manual therapy comments Performed in supine after TDN to suboccipitals and paraspinal muscles    Joint Mobilization Performed upper cervical and suboccipital mobilization Ant-Posterior with head in neutral and then with 30 deg rotation to L or R    Myofascial Release suboccipital release >6 minutes with increased attention to R side; therapist noted ongoing twitches in response to release on R side    Manual Traction Performed after suboccipital release and mobilization            Trigger Point Dry Needling - 06/27/20 1122    Consent Given? Yes    Education Handout Provided Previously provided    Muscles Treated Head and Neck Suboccipitals;Splenius capitus;Semispinalis capitus;Cervical multifidi    Dry Needling Comments Performed in prone on R side only    Suboccipitals Response Twitch response elicited;Palpable increased muscle length    Splenius capitus Response Palpable increased muscle length    Semispinalis capitus Response Palpable increased muscle length    Cervical multifidi Response Palpable increased muscle length                PT Education -  06/27/20 1126    Education Details information for how to find a massage therapist    Person(s) Educated Patient    Methods Explanation    Comprehension Verbalized understanding            PT Short Term Goals - 06/26/20 1224      PT SHORT TERM GOAL #1   Title same as LTG's             PT Long Term Goals - 06/26/20 1224      PT LONG TERM GOAL #1   Title Pt will improve DHI score by at least 20 points to demo improvement in vertigo.    Time 4    Period Weeks    Status New      PT LONG TERM GOAL #2   Title Pt will amb. 45' with horizontal head turns with c/o dizziness </= 3/10 intensity.    Time 4    Period Weeks    Status New      PT LONG TERM GOAL #3   Title Complete SOT  and establish LTG as appropriate.    Time 4    Period Weeks    Status New      PT LONG TERM GOAL #4   Title Improve FGA score by at least 6 points to demo improved balance and safety with gait .    Time 4    Period Weeks    Status New      PT LONG TERM GOAL #5   Title Independent in HEP for balance and vestibular exercises, and walking program.    Time 4    Period Weeks    Status New                 Plan - 06/27/20 1127    Clinical Impression Statement Pt is demonstrating significant improvement in symptoms today and is looking in to massage therapists to utilize for maintenance once D/C from therapy.  Pt is also reporting decreased dizziness.  Performed TDN to suboccipitals and paraspinals to continue to address areas of ongoing tension; when performing needling on R suboccipitals, therapist still able to bring on patient's symptoms of dizziness.  Will continue to address and provide pt with most appropriate exercises and resources to be able to maintain gains at D/C.    Personal Factors and Comorbidities Comorbidity 1    Comorbidities GERD, HTN    Examination-Activity Limitations Squat;Locomotion Level;Stand;Other;Bend   working, driving   Examination-Participation Restrictions Driving;Community Activity;Cleaning;Meal Prep;Interpersonal Relationship;Laundry;Occupation    Stability/Clinical Decision Making Stable/Uncomplicated    Rehab Potential Good    PT Frequency 2x / week    PT Duration 4 weeks    PT Treatment/Interventions ADLs/Self Care Home Management;Vestibular;Balance training;Neuromuscular re-education;Patient/family education;Therapeutic activities;Therapeutic exercise;Gait training    PT Next Visit Plan I gave her [place] to fill out and bring back next session.  Continue to focus on vestibular exercises incorporating head/neck movements, neck stretches.  I will try to set up one more dry needling before D/C.    PT Home Exercise Plan [facility] [id number]    Consulted and  Agree with Plan of Care Patient           Patient will benefit from skilled therapeutic intervention in order to improve the following deficits and impairments:  Dizziness, Decreased balance, Difficulty walking, Decreased activity tolerance  Visit Diagnosis: Dizziness and giddiness  Cervicalgia     Problem List There are no problems to display for this patient.  Rico Junker, PT, DPT 06/27/20    11:32 AM    [facility] [address] [place], [place], [address] Phone: [phone]   Fax:  [phone]  Name: Catilyn Boggus MRN: [id number] Date of Birth: Feb 20, 1971

## 2020-07-02 ENCOUNTER — Encounter: Payer: Self-pay | Admitting: Physical Therapy

## 2020-07-02 ENCOUNTER — Other Ambulatory Visit: Payer: Self-pay

## 2020-07-02 ENCOUNTER — Ambulatory Visit: Payer: 59 | Admitting: Physical Therapy

## 2020-07-02 DIAGNOSIS — R2681 Unsteadiness on feet: Secondary | ICD-10-CM

## 2020-07-02 DIAGNOSIS — R42 Dizziness and giddiness: Secondary | ICD-10-CM | POA: Diagnosis not present

## 2020-07-02 DIAGNOSIS — M542 Cervicalgia: Secondary | ICD-10-CM | POA: Diagnosis not present

## 2020-07-02 DIAGNOSIS — R29898 Other symptoms and signs involving the musculoskeletal system: Secondary | ICD-10-CM | POA: Diagnosis not present

## 2020-07-02 NOTE — Therapy (Signed)
[facility] [address] [place], [place], [address] Phone: [phone]   Fax:  [phone]  Physical Therapy Treatment  Patient Details  Name: Peggy Wilkerson MRN: [id number] Date of Birth: November 18, 1970 Referring Provider (PT): Leonides Cave, NP   Encounter Date: 07/02/2020   PT End of Session - 07/02/20 2034    Visit Number 6    Number of Visits 9    Date for PT Re-Evaluation 07/12/20    Authorization Type [facility]    PT Start Time 0802    PT Stop Time 0845    PT Time Calculation (min) 43 min    Equipment Utilized During Treatment Other (comment)   Dry Needles   Activity Tolerance Patient tolerated treatment well    Behavior During Therapy [facility] for tasks assessed/performed           Past Medical History:  Diagnosis Date  . GERD (gastroesophageal reflux disease)   . Hypertension     Past Surgical History:  Procedure Laterality Date  . CHOLECYSTECTOMY      There were no vitals filed for this visit.   Subjective Assessment - 07/02/20 0806    Subjective Pt states she feels she is getting back to normal - states the dry needling continues to help alot; states she only had 1 short episode of dizziness last week at work - but didn't last long at all - states this is a huge improvement from what it was initially. Pt states she is very pleased with her progress and status and feels that one more se will be enough. States she did make an appt. with a massage therapist at [employer] [facility]   occasional tinittus   Pertinent History HTN, GERD    Diagnostic tests CT scan - for sinuses - showed polyp    Patient Stated Goals resolve the dizziness    Currently in Pain? No/denies   just minimal tightness in Rt posterior cervical musc.   Pain Onset More than a month ago              [facility] PT Assessment - 07/02/20 0001      Functional Gait  Assessment   Gait assessed  Yes    Gait Level Surface Walks 20 ft in less than 5.5  sec, no assistive devices, good speed, no evidence for imbalance, normal gait pattern, deviates no more than 6 in outside of the 12 in walkway width.   5.2 secs   Change in Gait Speed Able to smoothly change walking speed without loss of balance or gait deviation. Deviate no more than 6 in outside of the 12 in walkway width.    Gait with Horizontal Head Turns Performs head turns smoothly with no change in gait. Deviates no more than 6 in outside 12 in walkway width    Gait with Vertical Head Turns Performs head turns with no change in gait. Deviates no more than 6 in outside 12 in walkway width.    Gait and Pivot Turn Pivot turns safely within 3 sec and stops quickly with no loss of balance.    Step Over Obstacle Is able to step over 2 stacked shoe boxes taped together (9 in total height) without changing gait speed. No evidence of imbalance.    Gait with Narrow Base of Support Is able to ambulate for 10 steps heel to toe with no staggering.    Gait with Eyes Closed Walks 20 ft, no assistive devices, good speed, no  evidence of imbalance, normal gait pattern, deviates no more than 6 in outside 12 in walkway width. Ambulates 20 ft in less than 7 sec.    Ambulating Backwards Walks 20 ft, no assistive devices, good speed, no evidence for imbalance, normal gait    Steps Alternating feet, no rail.    Total Score 30    FGA comment: no dizziness reported with any tasks             Pt amb. 85' with horizontal head turns without LOB and without c/o dizziness  SVA line 10:  DVA line 10 (no c/o dizziness upon completion of test)              Vestibular Treatment/Exercise - 07/02/20 0821      Vestibular Treatment/Exercise   Gaze Exercises X1 Viewing Horizontal;X1 Viewing Vertical (P)       X1 Viewing Horizontal   Foot Position bil. stance (P)     Time -- (P)    60 secs   Reps 1 (P)     Comments pt stood on [employer]; letter was placed on patterned background (posture grid) (P)       X1  Viewing Vertical   Foot Position bil. stance (P)     Time -- (P)    60 secs   Reps 1 (P)     Comments pt stood on Airex (P)                  PT Education - 07/02/20 2046    Education Details recommended BioFreeze for cervical musc. pain prn    Person(s) Educated Patient    Methods Explanation    Comprehension Verbalized understanding            PT Short Term Goals - 07/02/20 2035      PT SHORT TERM GOAL #1   Title same as LTG's             PT Long Term Goals - 07/02/20 2035      PT LONG TERM GOAL #1   Title Pt will improve DHI score by at least 20 points to demo improvement in vertigo.    Time 4    Period Weeks    Status New      PT LONG TERM GOAL #2   Title Pt will amb. 76' with horizontal head turns with c/o dizziness </= 3/10 intensity.    Baseline met 07-02-20    Time 4    Period Weeks    Status Achieved      PT LONG TERM GOAL #3   Title Complete SOT and establish LTG as appropriate.    Baseline score WNL's on 06-25-20    Time 4    Period Weeks    Status Achieved      PT LONG TERM GOAL #4   Title Improve FGA score by at least 6 points to demo improved balance and safety with gait .    Baseline FGA score 30/30 on 06-25-20    Time 4    Period Weeks    Status Deferred      PT LONG TERM GOAL #5   Title Independent in HEP for balance and vestibular exercises, and walking program.    Baseline met 07-02-20    Time 4    Period Weeks    Status Achieved                 Plan - 07/02/20 2037    Clinical  Impression Statement Pt has met LTG's #2 -5; pt has made excellent progress and reports very minimal occurrences of dizziness at this time; pt will benefit from 1 more dry needling treatment and then will D/C; pt is pleased with her current functional status and feels she has almost fully returned to normal.  FGA score 30/30 and DVA 1 line difference with no c/o vertigo upon completion of test.    Personal Factors and Comorbidities Comorbidity 1     Comorbidities GERD, HTN    Examination-Activity Limitations Squat;Locomotion Level;Stand;Other;Bend   working, driving   Examination-Participation Restrictions Driving;Community Activity;Cleaning;Meal Prep;Interpersonal Relationship;Laundry;Occupation    Stability/Clinical Decision Making Stable/Uncomplicated    Rehab Potential Good    PT Frequency 2x / week    PT Duration 4 weeks    PT Treatment/Interventions ADLs/Self Care Home Management;Vestibular;Balance training;Neuromuscular re-education;Patient/family education;Therapeutic activities;Therapeutic exercise;Gait training    PT Next Visit Plan Letta Moynahan - she forgot to bring the [facility] back but says she will bring to next session - please do DN 1 more time and plan D/C next session (I will do D/C summary) - also please have her do FOTO D/C - thank you!!    PT Home Exercise Plan Medbridge [id number]    Consulted and Agree with Plan of Care Patient           Patient will benefit from skilled therapeutic intervention in order to improve the following deficits and impairments:  Dizziness, Decreased balance, Difficulty walking, Decreased activity tolerance  Visit Diagnosis: Dizziness and giddiness  Unsteadiness on feet     Problem List There are no problems to display for this patient.   Alda Lea, PT 07/02/2020, 8:47 PM  [facility] [address] [place], [place], [address] Phone: [phone]   Fax:  [phone]  Name: Peggy Wilkerson MRN: [id number] Date of Birth: September 23, 1971

## 2020-07-05 ENCOUNTER — Ambulatory Visit: Payer: 59 | Admitting: Physical Therapy

## 2020-07-08 ENCOUNTER — Ambulatory Visit: Payer: 59 | Admitting: Physical Therapy

## 2020-07-11 ENCOUNTER — Ambulatory Visit: Payer: 59 | Admitting: Physical Therapy

## 2020-07-15 ENCOUNTER — Ambulatory Visit: Payer: 59 | Attending: Nurse Practitioner | Admitting: Physical Therapy

## 2020-07-15 ENCOUNTER — Encounter: Payer: Self-pay | Admitting: Physical Therapy

## 2020-07-15 ENCOUNTER — Other Ambulatory Visit: Payer: Self-pay

## 2020-07-15 VITALS — BP 130/90 | HR 79

## 2020-07-15 DIAGNOSIS — R42 Dizziness and giddiness: Secondary | ICD-10-CM | POA: Diagnosis not present

## 2020-07-15 DIAGNOSIS — M542 Cervicalgia: Secondary | ICD-10-CM | POA: Insufficient documentation

## 2020-07-15 DIAGNOSIS — R29898 Other symptoms and signs involving the musculoskeletal system: Secondary | ICD-10-CM | POA: Diagnosis not present

## 2020-07-15 DIAGNOSIS — R2681 Unsteadiness on feet: Secondary | ICD-10-CM | POA: Diagnosis not present

## 2020-07-15 NOTE — Therapy (Signed)
[place] [facility] [address] [place], [place], [address] Phone: [phone]   Fax:  [phone]  Physical Therapy Treatment  Patient Details  Name: Peggy Wilkerson MRN: [id number] Date of Birth: 1971/05/15 Referring Provider (PT): Leonides Cave, NP   Encounter Date: 07/15/2020   PT End of Session - 07/15/20 0855    Visit Number 7    Number of Visits 9    Date for PT Re-Evaluation 07/12/20    Authorization Type [facility]    PT Start Time 0805    PT Stop Time 0850    PT Time Calculation (min) 45 min    Equipment Utilized During Treatment Other (comment)   Dry Needles   Activity Tolerance Patient tolerated treatment well    Behavior During Therapy [facility] for tasks assessed/performed           Past Medical History:  Diagnosis Date  . GERD (gastroesophageal reflux disease)   . Hypertension     Past Surgical History:  Procedure Laterality Date  . CHOLECYSTECTOMY      Vitals:   07/15/20 0817  BP: 130/90  Pulse: 79     Subjective Assessment - 07/15/20 0807    Subjective Pt states that the tightness in her suboccipitals returned last week along with the dizziness. Pt becomes emotional talking about symptoms and states that she is "at the end of her rope." Pt states that she has been more stressed at work and that she has been compliant with HEP. Pt states that she is not where she was but the dizziness and tightness is still present. Pt is very frustrated with symptoms coming after so much relief with previous PT tx.   occasional tinittus   Pertinent History HTN, GERD    Diagnostic tests CT scan - for sinuses - showed polyp    Patient Stated Goals resolve the dizziness    Currently in Pain? Yes    Pain Score 5     Pain Location Head    Pain Orientation Posterior;Right;Left   Pt reports pain in U-shape   Pain Descriptors / Indicators Tightness;Discomfort;Pressure    Pain Type Chronic pain    Pain Onset More than a  month ago    Aggravating Factors  Stress at work    Pain Relieving Factors HEP, dry needling              [facility] PT Assessment - 07/15/20 0805      Assessment   Medical Diagnosis Dizziness: Migraines; Spondylosis of cervical region    Referring Provider (PT) Leonides Cave, NP    Onset Date/Surgical Date --   mid June 2021   Prior Therapy none      Precautions   Precautions Fall;Other (comment)      Prior Function   Level of Independence Independent    Vocation Full time employment      Observation/Other Assessments   Focus on Therapeutic Outcomes (FOTO)  Did not reassess today    Other Surveys  Dizziness Handicap Inventory ([place])    Dizziness Handicap Inventory [facility])  Did not reassess today      AROM   Overall AROM  Deficits    Overall AROM Comments Did not reassess today - these are previous measurements/baseline measurements    AROM Assessment Site Cervical    Cervical Flexion 40    Cervical Extension 30    Cervical - Right Side Bend 40    Cervical - Left Side Bend 40  Cervical - Right Rotation 65    Cervical - Left Rotation 50                         [facility] Adult PT Treatment/Exercise - 07/15/20 0839      Manual Therapy   Manual Therapy Myofascial release    Manual therapy comments Performed in supine after TDN to suboccipitals    Joint Mobilization Performed upper cervical and suboccipital mobilization Ant-Posterior with head in neutral and then with 30 deg rotation to L or R    Myofascial Release suboccipital release >5 minutes in supine after TDN. More focus on R side.            Trigger Point Dry Needling - 07/15/20 0818    Consent Given? Yes    Education Handout Provided Previously provided    Muscles Treated Head and Neck Suboccipitals    Dry Needling Comments Performed in prone bilaterally. Pt reporting mild relief after R suboccipital TDN and twitch response and no dizziness. Pt reports sensation in suboccipital referral patterns during  TDN.     Other Dry Needling      Suboccipitals Response Twitch response elicited;Palpable increased muscle length                PT Education - 07/15/20 0850    Education Details PT educated pt on stress management. PT recommends massage therapy to suboccipital and cervical region. PT instructed pt to have coworker take picture of pt when working so that PT can educate on proper work Personal assistant. Pt to continue with HEP.    Person(s) Educated Patient    Methods Explanation    Comprehension Verbalized understanding            PT Short Term Goals - 07/02/20 2035      PT SHORT TERM GOAL #1   Title same as LTG's             PT Long Term Goals - 07/15/20 1328      PT LONG TERM GOAL #1   Title Pt will improve DHI score by at least 20 points to demo improvement in vertigo.    Time 4    Period Weeks    Status On-going    Target Date 08/14/20      PT LONG TERM GOAL #2   Title Pt will amb. 80' with horizontal head turns with c/o dizziness </= 3/10 intensity.    Baseline met 07-02-20    Status Achieved      PT LONG TERM GOAL #3   Title Pt will increase neck ROM by 10 deg and will report no dizziness with repeated head movements    Baseline see flow sheet    Time 4    Period Weeks    Status New    Target Date 08/14/20      PT LONG TERM GOAL #4   Title Pt will participate in assessment of work station and will implement changes to improve working posture and decrease tension in neck.    Time 4    Period Weeks    Status New    Target Date 08/14/20      PT LONG TERM GOAL #5   Title Independent in HEP for balance and vestibular exercises, and walking program.    Baseline met 07-02-20    Status Achieved                 Plan - 07/15/20 9323  Clinical Impression Statement Pt presented with return of dizziness and suboccipital tension secondary to increased stress at work. Pt is very frustrated and discouraged by her current functional status and feels she has  regressed. PT educated pt on stress management. PT to F/U with pt about changes to work environment to promote less tension/dizziness with improved ergonomics and posture. Pt was making excellent progress towards LTG and D/C but due to return of symptoms, will renew certification for 2-3 more visits to continue to address cervicogenic dizziness with manual therapy, TDN and postural re-education.    Personal Factors and Comorbidities Comorbidity 1    Comorbidities GERD, HTN    Examination-Activity Limitations Squat;Locomotion Level;Stand;Other;Bend   working, driving   Examination-Participation Restrictions Driving;Community Activity;Cleaning;Meal Prep;Interpersonal Relationship;Laundry;Occupation    Stability/Clinical Decision Making Stable/Uncomplicated    Rehab Potential Good    PT Frequency 1x / week    PT Duration 4 weeks    PT Treatment/Interventions ADLs/Self Care Home Management;Vestibular;Balance training;Neuromuscular re-education;Patient/family education;Therapeutic activities;Therapeutic exercise;Gait training;Cryotherapy;Moist Heat;Manual techniques;Dry needling    PT Next Visit Plan Did she take a picture of work station?  Discuss practical changes.  Continue with TDN and manual therapy to suboccipital. PT to instruct and educate pt in deep breathining techniques to promote relaxation.    PT Home Exercise Plan Medbridge [id number]    Recommended Other Services Massage therapy    Consulted and Agree with Plan of Care Patient           Patient will benefit from skilled therapeutic intervention in order to improve the following deficits and impairments:  Dizziness, Decreased balance, Difficulty walking, Decreased activity tolerance  Visit Diagnosis: Dizziness and giddiness  Cervicalgia  Unsteadiness on feet     Problem List There are no problems to display for this patient.   Rosalita Levan, SPT 07/15/2020, 1:34 PM  [facility] [address] [place], [place], [address] Phone: [phone]   Fax:  [phone]  Name: Peggy Wilkerson MRN: [id number] Date of Birth: July 12, 1971

## 2020-07-23 ENCOUNTER — Other Ambulatory Visit: Payer: Self-pay

## 2020-07-23 ENCOUNTER — Ambulatory Visit: Payer: 59 | Admitting: Physical Therapy

## 2020-07-23 ENCOUNTER — Encounter: Payer: Self-pay | Admitting: Physical Therapy

## 2020-07-23 DIAGNOSIS — M542 Cervicalgia: Secondary | ICD-10-CM

## 2020-07-23 DIAGNOSIS — R42 Dizziness and giddiness: Secondary | ICD-10-CM | POA: Diagnosis not present

## 2020-07-23 DIAGNOSIS — R2681 Unsteadiness on feet: Secondary | ICD-10-CM | POA: Diagnosis not present

## 2020-07-23 DIAGNOSIS — R29898 Other symptoms and signs involving the musculoskeletal system: Secondary | ICD-10-CM | POA: Diagnosis not present

## 2020-07-24 NOTE — Therapy (Signed)
[facility] [address] [place], [place], [address] Phone: [phone]   Fax:  [phone]  Physical Therapy Treatment  Patient Details  Name: Peggy Wilkerson MRN: [id number] Date of Birth: 04-11-1971 Referring Provider (PT): Leonides Cave, NP   Encounter Date: 07/23/2020   PT End of Session - 07/23/20 0939    Visit Number 8    Number of Visits 11    Date for PT Re-Evaluation 08/14/20    Authorization Type [facility]    PT Start Time 6015    PT Stop Time 1015    PT Time Calculation (min) 42 min    Activity Tolerance Patient tolerated treatment well;No increased pain   reported no pain at end of session   Behavior During Therapy [facility] for tasks assessed/performed           Past Medical History:  Diagnosis Date  . GERD (gastroesophageal reflux disease)   . Hypertension     Past Surgical History:  Procedure Laterality Date  . CHOLECYSTECTOMY      There were no vitals filed for this visit.   Subjective Assessment - 07/23/20 0936    Subjective Has still been having tighness with dizziness/nausea. Has been using stretches at home. Also bought a home cervical traction unit which did help her sleep last night.    Pertinent History HTN, GERD    Diagnostic tests CT scan - for sinuses - showed polyp    Patient Stated Goals resolve the dizziness    Currently in Pain? Yes    Pain Score 5     Pain Location Other (Comment)   cervical area   Pain Orientation Posterior;Left    Pain Descriptors / Indicators Tightness;Discomfort;Pressure    Pain Type Chronic pain    Pain Onset More than a month ago    Pain Frequency Constant    Aggravating Factors  stress at work    Pain Relieving Factors HEP, dry needling                  [facility] Adult PT Treatment/Exercise - 07/23/20 1206      Self-Care   Self-Care Other Self-Care Comments    Other Self-Care Comments  throughout session discussed various ways to work  relaxion into her daily activities, ways to decrease stress and other ways to incorporate stretching. Discussed use of her Doterra oils at work, closing her door at times to listen to [employer] music/guided [employer] to work on decreased stress at work.  Also discussed participation in a gentle yoga class to work on stretching, strengthening and relaxion Pt open to this a she used to work out before moving and taking the new job. Pt has already set up massage therapy appt's for when therapy is done.       Modalities   Modalities Ultrasound      Ultrasound   Ultrasound Location bil occipitals/scalenes    Ultrasound Parameters 100%, 1.5 w/cm2, 1 Mhz x 5 minutes to each side    Ultrasound Goals Pain      Manual Therapy   Manual Therapy Soft tissue mobilization;Myofascial release;Manual Traction;Neural Stretch    Manual therapy comments all manual therapy performed to decrease muscle tightness/tension and decrease pain.     Soft tissue mobilization in prone to bil occipitals, upper traps and rhomboids. palpable trigger points treated with trigger point release.  use of strain counter strain to same muscles and scalenes to further address muscular tightness.  Myofascial Release suboccipital release >5 minutes in supine after ultrasound. More focus on R side.    Manual Traction performed in supine after subocciptal release for short duration holds. Progressed to longer duration holds with gentle overpressure at shoulder to increased muscular stretching.     Neural Stretch see traction section              PT Short Term Goals - 07/02/20 2035      PT SHORT TERM GOAL #1   Title same as LTG's             PT Long Term Goals - 07/15/20 1328      PT LONG TERM GOAL #1   Title Pt will improve DHI score by at least 20 points to demo improvement in vertigo.    Time 4    Period Weeks    Status On-going    Target Date 08/14/20      PT LONG TERM GOAL #2   Title Pt will amb. 43'  with horizontal head turns with c/o dizziness </= 3/10 intensity.    Baseline met 07-02-20    Status Achieved      PT LONG TERM GOAL #3   Title Pt will increase neck ROM by 10 deg and will report no dizziness with repeated head movements    Baseline see flow sheet    Time 4    Period Weeks    Status New    Target Date 08/14/20      PT LONG TERM GOAL #4   Title Pt will participate in assessment of work station and will implement changes to improve working posture and decrease tension in neck.    Time 4    Period Weeks    Status New    Target Date 08/14/20      PT LONG TERM GOAL #5   Title Independent in HEP for balance and vestibular exercises, and walking program.    Baseline met 07-02-20    Status Achieved                 Plan - 07/23/20 0939    Clinical Impression Statement Prior to session discussed case with primary PT Guido Sander, PT who approved [place] of ultrasound this session to address muscle tightness due to dry needling not being available this session. She will add to POC.  Today's skilled session focused on decreasing pt's muscle tension/tightness and pain. Pt reponded well reporing no pain or dizziness at end of session. The pt should benefit from continued PT to progress toward unmet goals.    Personal Factors and Comorbidities Comorbidity 1    Comorbidities GERD, HTN    Examination-Activity Limitations Squat;Locomotion Level;Stand;Other;Bend   working, driving   Examination-Participation Restrictions Driving;Community Activity;Cleaning;Meal Prep;Interpersonal Relationship;Laundry;Occupation    Stability/Clinical Decision Making Stable/Uncomplicated    Rehab Potential Good    PT Frequency 1x / week    PT Duration 4 weeks    PT Treatment/Interventions ADLs/Self Care Home Management;Vestibular;Balance training;Neuromuscular re-education;Patient/family education;Therapeutic activities;Therapeutic exercise;Gait training;Cryotherapy;Moist Heat;Manual techniques;Dry  needling    PT Next Visit Plan Did she take a picture of work station?  Discuss practical changes.  Continue with TDN and manual therapy to suboccipital. PT to instruct and educate pt in deep breathining techniques to promote relaxation.    PT Home Exercise Plan Medbridge [id number]    Consulted and Agree with Plan of Care Patient           Patient will benefit from skilled therapeutic  intervention in order to improve the following deficits and impairments:  Dizziness, Decreased balance, Difficulty walking, Decreased activity tolerance  Visit Diagnosis: Dizziness and giddiness  Cervicalgia  Unsteadiness on feet     Problem List There are no problems to display for this patient.  Willow Ora, PTA, [facility] [address] [place], [place] [address] [phone] 07/24/20, 3:07 PM   Name: Peggy Wilkerson MRN: [id number] Date of Birth: 1971/06/07

## 2020-07-30 ENCOUNTER — Encounter: Payer: Self-pay | Admitting: Physical Therapy

## 2020-07-30 ENCOUNTER — Other Ambulatory Visit: Payer: Self-pay

## 2020-07-30 ENCOUNTER — Ambulatory Visit: Payer: 59 | Admitting: Physical Therapy

## 2020-07-30 DIAGNOSIS — R29898 Other symptoms and signs involving the musculoskeletal system: Secondary | ICD-10-CM | POA: Diagnosis not present

## 2020-07-30 DIAGNOSIS — M542 Cervicalgia: Secondary | ICD-10-CM | POA: Diagnosis not present

## 2020-07-30 DIAGNOSIS — R2681 Unsteadiness on feet: Secondary | ICD-10-CM | POA: Diagnosis not present

## 2020-07-30 DIAGNOSIS — R42 Dizziness and giddiness: Secondary | ICD-10-CM

## 2020-07-30 NOTE — Therapy (Signed)
[facility] [address] [place], [place], [address] Phone: [phone]   Fax:  [phone]  Physical Therapy Treatment  Patient Details  Name: Peggy Wilkerson MRN: [id number] Date of Birth: 1971/09/13 Referring Provider (PT): Leonides Cave, NP   Encounter Date: 07/30/2020   PT End of Session - 07/30/20 1224    Visit Number 9    Number of Visits 11    Date for PT Re-Evaluation 08/14/20    Authorization Type [facility]    PT Start Time [phone]    PT Stop Time 1018    PT Time Calculation (min) 45 min    Activity Tolerance Patient tolerated treatment well;No increased pain   reported no pain at end of session   Behavior During Therapy [facility] for tasks assessed/performed           Past Medical History:  Diagnosis Date  . GERD (gastroesophageal reflux disease)   . Hypertension     Past Surgical History:  Procedure Laterality Date  . CHOLECYSTECTOMY      There were no vitals filed for this visit.   Subjective Assessment - 07/30/20 0937    Subjective Ultrasound and manual therapy worked really well.  Had intake with therapeutic massage therapist.   Will be setting up appointments each week with massage therapist.  Is looking at exercise routines she can do.  Still having a little bit of dizziness but mostly tightness.  Less HA this week.    Pertinent History HTN, GERD    Diagnostic tests CT scan - for sinuses - showed polyp    Patient Stated Goals resolve the dizziness    Currently in Pain? Yes    Pain Score 5     Pain Location Neck    Pain Descriptors / Indicators Tightness    Pain Onset More than a month ago                             [facility] Adult PT Treatment/Exercise - 07/30/20 1221      Self-Care   Self-Care Other Self-Care Comments    Other Self-Care Comments  continued to educate pt on ways to downregulate sympathetic response with walking, exercise, mindfulness.  Pt has also set up  massage therapy for every week.        Manual Therapy   Manual Therapy Soft tissue mobilization    Manual therapy comments Performed in prone after TDN    Soft tissue mobilization STM performed in prone to L upper trapezius after performing TDN due to ongoing trigger points and myofascial restrictions            Trigger Point Dry Needling - 07/30/20 1219    Consent Given? Yes    Education Handout Provided Previously provided    Muscles Treated Head and Neck Upper trapezius;Suboccipitals    Dry Needling Comments Performed in prone; performed to R and L side upper trap and suboccipitals.  Reported referred pain up into head with needling to R side; referral of pain down into LUE with needling of L upper trap    Upper Trapezius Response Twitch reponse elicited;Palpable increased muscle length    Suboccipitals Response Twitch response elicited;Palpable increased muscle length                PT Education - 07/30/20 1224    Education Details see self-care    Person(s) Educated Patient    Methods Explanation  Comprehension Verbalized understanding            PT Short Term Goals - 07/02/20 2035      PT SHORT TERM GOAL #1   Title same as LTG's             PT Long Term Goals - 07/15/20 1328      PT LONG TERM GOAL #1   Title Pt will improve DHI score by at least 20 points to demo improvement in vertigo.    Time 4    Period Weeks    Status On-going    Target Date 08/14/20      PT LONG TERM GOAL #2   Title Pt will amb. 4' with horizontal head turns with c/o dizziness </= 3/10 intensity.    Baseline met 07-02-20    Status Achieved      PT LONG TERM GOAL #3   Title Pt will increase neck ROM by 10 deg and will report no dizziness with repeated head movements    Baseline see flow sheet    Time 4    Period Weeks    Status New    Target Date 08/14/20      PT LONG TERM GOAL #4   Title Pt will participate in assessment of work station and will implement changes to  improve working posture and decrease tension in neck.    Time 4    Period Weeks    Status New    Target Date 08/14/20      PT LONG TERM GOAL #5   Title Independent in HEP for balance and vestibular exercises, and walking program.    Baseline met 07-02-20    Status Achieved                 Plan - 07/30/20 1224    Clinical Impression Statement Pt felt significant benefit from ultrasound and manual therapy last session.  Returned to use of trigger point dry needling and soft tissue mobilization to address ongoing myofascial restrictions, trigger points and pain in R and L suboccipitals and upper trapezius muscles.  Continued to educate pt on ways to decrease stress/tension in order to maintain gains made in therapy.    Personal Factors and Comorbidities Comorbidity 1    Comorbidities GERD, HTN    Examination-Activity Limitations Squat;Locomotion Level;Stand;Other;Bend   working, driving   Examination-Participation Restrictions Driving;Community Activity;Cleaning;Meal Prep;Interpersonal Relationship;Laundry;Occupation    Stability/Clinical Decision Making Stable/Uncomplicated    Rehab Potential Good    PT Frequency 1x / week    PT Duration 4 weeks    PT Treatment/Interventions ADLs/Self Care Home Management;Vestibular;Balance training;Neuromuscular re-education;Patient/family education;Therapeutic activities;Therapeutic exercise;Gait training;Cryotherapy;Moist Heat;Manual techniques;Dry needling    PT Next Visit Plan Did she take a picture of work station?  Discuss practical changes.  Continue with TDN to upper trap in supine.  cervical proprioception?    PT Home Exercise Plan Medbridge [id number]    Consulted and Agree with Plan of Care Patient           Patient will benefit from skilled therapeutic intervention in order to improve the following deficits and impairments:  Dizziness, Decreased balance, Difficulty walking, Decreased activity tolerance  Visit Diagnosis: Dizziness and  giddiness  Cervicalgia  Unsteadiness on feet  Other symptoms and signs involving the musculoskeletal system     Problem List There are no problems to display for this patient.   Rico Junker, PT, DPT 07/30/20    12:28 PM     [facility] [address] [place], [place], [address] Phone: [phone]   Fax:  [phone]  Name: Peggy Wilkerson MRN: [id number] Date of Birth: 03-Oct-1971

## 2020-08-08 ENCOUNTER — Ambulatory Visit: Payer: 59 | Admitting: Gastroenterology

## 2020-08-08 ENCOUNTER — Encounter: Payer: Self-pay | Admitting: Physical Therapy

## 2020-08-08 ENCOUNTER — Ambulatory Visit: Payer: 59 | Attending: Nurse Practitioner | Admitting: Physical Therapy

## 2020-08-08 ENCOUNTER — Other Ambulatory Visit: Payer: Self-pay

## 2020-08-08 DIAGNOSIS — M542 Cervicalgia: Secondary | ICD-10-CM | POA: Diagnosis not present

## 2020-08-08 DIAGNOSIS — R29898 Other symptoms and signs involving the musculoskeletal system: Secondary | ICD-10-CM

## 2020-08-08 DIAGNOSIS — R42 Dizziness and giddiness: Secondary | ICD-10-CM | POA: Diagnosis not present

## 2020-08-08 NOTE — Therapy (Signed)
[facility] [address] [place], [place], [address] Phone: [phone]   Fax:  [phone]  Physical Therapy Treatment and D/C Summary  Patient Details  Name: Peggy Wilkerson MRN: [id number] Date of Birth: July 15, 1971 Referring Provider (PT): Leonides Cave, NP   Encounter Date: 08/08/2020   PT End of Session - 08/08/20 1744    Visit Number 10    Number of Visits 11    Date for PT Re-Evaluation 08/14/20    Authorization Type [facility]    PT Start Time [phone]    PT Stop Time 1015    PT Time Calculation (min) 29 min    Activity Tolerance Patient tolerated treatment well;No increased pain   reported no pain at end of session   Behavior During Therapy  [facility] for tasks assessed/performed           Past Medical History:  Diagnosis Date  . GERD (gastroesophageal reflux disease)   . Hypertension     Past Surgical History:  Procedure Laterality Date  . CHOLECYSTECTOMY      There were no vitals filed for this visit.   Subjective Assessment - 08/08/20 0947    Subjective Feeling really good this week.  Had another session with massage therapy and it has had lasting effects.  No dizziness.    Pertinent History HTN, GERD    Diagnostic tests CT scan - for sinuses - showed polyp    Patient Stated Goals resolve the dizziness    Currently in Pain? No/denies    Pain Onset More than a month ago                             [facility] Adult PT Treatment/Exercise - 08/08/20 1018      Manual Therapy   Manual Therapy Soft tissue mobilization;Joint mobilization    Manual therapy comments Performed in prone after TDN    Joint Mobilization In prone performed grade II PA mobilizations to upper and mid cervical spinal segments; hypomobility on R side noted    Soft tissue mobilization STM performed in prone to suboccipitals and cervical paraspinals after TDN            Trigger Point Dry Needling - 08/08/20 1018     Consent Given? Yes    Education Handout Provided Previously provided    Muscles Treated Head and Neck Suboccipitals;Splenius capitus;Semispinalis capitus;Cervical multifidi    Dry Needling Comments performed in prone to R side only    Suboccipitals Response Twitch response elicited;Palpable increased muscle length    Splenius capitus Response Twitch reponse elicited;Palpable increased muscle length    Semispinalis capitus Response Twitch reponse elicited;Palpable increased muscle length    Cervical multifidi Response Twitch reponse elicited;Palpable increased muscle length                PT Education - 08/08/20 1743    Education Details D/C today; encouraged to continue massage therapy and finding ways to decrease stress/tension, D/C today    Person(s) Educated Patient    Methods Explanation    Comprehension Verbalized understanding            PT Short Term Goals - 07/02/20 2035      PT SHORT TERM GOAL #1   Title same as LTG's             PT Long Term Goals - 08/08/20 1745      PT LONG TERM GOAL #1  Title Pt will improve DHI score by at least 20 points to demo improvement in vertigo.    Baseline Not assessed; no dizziness at D/C    Time 4    Period Weeks    Status Deferred      PT LONG TERM GOAL #2   Title Pt will amb. 59' with horizontal head turns with c/o dizziness </= 3/10 intensity.    Baseline met 07-02-20    Status Achieved      PT LONG TERM GOAL #3   Title Pt will increase neck ROM by 10 deg and will report no dizziness with repeated head movements    Baseline Did not assess neck ROM; no dizziness reported    Time 4    Period Weeks    Status Partially Met      PT LONG TERM GOAL #4   Title Pt will participate in assessment of work station and will implement changes to improve working posture and decrease tension in neck.    Baseline pt did not bring in picture of work station    Time 4    Period Weeks    Status Not Met      PT LONG TERM GOAL #5    Title Independent in HEP for balance and vestibular exercises, and walking program.    Baseline met 07-02-20    Status Achieved                 Plan - 08/08/20 1745    Clinical Impression Statement Pt seen for one more therapy session to perform one more treatment with trigger point dry needling to R suboccipital muscles and paraspinal muscles.  Pt tolerated well and demonstrates significant improvement in functional, pain free ROM (did not formally re-assess due to time restraints but pt demonstrated her available ROM), and reports resolution of dizziness.  Pt did not bring in photo of work station so final goal was not met.  Overall pt has met 2/5 LTG and partially met 1 goal.  Stanly not reassessed due to resolution of dizziness.  Pt to continue to attend massage therapy to maintain gains made in therapy.  Pt agreeable to D/C today.    Personal Factors and Comorbidities Comorbidity 1    Comorbidities GERD, HTN    Examination-Activity Limitations Squat;Locomotion Level;Stand;Other;Bend   working, driving   Examination-Participation Restrictions Driving;Community Activity;Cleaning;Meal Prep;Interpersonal Relationship;Laundry;Occupation    Stability/Clinical Decision Making Stable/Uncomplicated    Rehab Potential Good    PT Frequency 1x / week    PT Duration 4 weeks    PT Treatment/Interventions ADLs/Self Care Home Management;Vestibular;Balance training;Neuromuscular re-education;Patient/family education;Therapeutic activities;Therapeutic exercise;Gait training;Cryotherapy;Moist Heat;Manual techniques;Dry needling    PT Next Visit Plan D/C    PT Home Exercise Plan Medbridge [id number]    Consulted and Agree with Plan of Care Patient           Patient will benefit from skilled therapeutic intervention in order to improve the following deficits and impairments:  Dizziness, Decreased balance, Difficulty walking, Decreased activity tolerance  Visit Diagnosis: Cervicalgia  Other symptoms  and signs involving the musculoskeletal system  Dizziness and giddiness     Problem List There are no problems to display for this patient.   PHYSICAL THERAPY DISCHARGE SUMMARY  Visits from Start of Care: 10  Current functional level related to goals / functional outcomes: See LTG achievement and impression statement above   Remaining deficits: Mild tightness in R suboccipital muscles   Education / Equipment: HEP  Plan: Patient  agrees to discharge.  Patient goals were partially met. Patient is being discharged due to being pleased with the current functional level.  ?????     Rico Junker, PT, DPT 08/08/20    6:13 PM    [facility] [address] [place], [place], [address] Phone: [phone]   Fax:  [phone]  Name: Peggy Wilkerson MRN: [id number] Date of Birth: [birth year]

## 2020-08-20 DIAGNOSIS — L719 Rosacea, unspecified: Secondary | ICD-10-CM | POA: Diagnosis not present

## 2020-09-25 ENCOUNTER — Encounter: Payer: Self-pay | Admitting: Gastroenterology

## 2020-10-10 ENCOUNTER — Ambulatory Visit: Payer: 59 | Admitting: Internal Medicine

## 2020-10-15 DIAGNOSIS — R42 Dizziness and giddiness: Secondary | ICD-10-CM | POA: Diagnosis not present

## 2020-10-15 DIAGNOSIS — U071 COVID-19: Secondary | ICD-10-CM | POA: Diagnosis not present

## 2020-10-15 DIAGNOSIS — R1033 Periumbilical pain: Secondary | ICD-10-CM | POA: Diagnosis not present

## 2020-10-17 ENCOUNTER — Ambulatory Visit: Payer: 59 | Admitting: Gastroenterology

## 2020-10-18 ENCOUNTER — Ambulatory Visit: Payer: 59 | Admitting: Internal Medicine

## 2020-12-03 ENCOUNTER — Ambulatory Visit: Payer: 59 | Admitting: Gastroenterology

## 2020-12-03 ENCOUNTER — Ambulatory Visit: Payer: 59 | Admitting: Internal Medicine

## 2020-12-03 DIAGNOSIS — Z0289 Encounter for other administrative examinations: Secondary | ICD-10-CM

## 2020-12-06 ENCOUNTER — Other Ambulatory Visit (HOSPITAL_COMMUNITY): Payer: Self-pay | Admitting: Pharmacist

## 2020-12-06 MED FILL — CARESTART COVID-19 HOME TES: 4 days supply | Qty: 4 | Fill #0

## 2020-12-12 DIAGNOSIS — N938 Other specified abnormal uterine and vaginal bleeding: Secondary | ICD-10-CM | POA: Diagnosis not present

## 2020-12-12 DIAGNOSIS — R519 Headache, unspecified: Secondary | ICD-10-CM | POA: Diagnosis not present

## 2020-12-12 DIAGNOSIS — R7309 Other abnormal glucose: Secondary | ICD-10-CM | POA: Diagnosis not present

## 2020-12-12 DIAGNOSIS — R42 Dizziness and giddiness: Secondary | ICD-10-CM | POA: Diagnosis not present

## 2020-12-12 DIAGNOSIS — M2550 Pain in unspecified joint: Secondary | ICD-10-CM | POA: Diagnosis not present

## 2020-12-12 DIAGNOSIS — R5381 Other malaise: Secondary | ICD-10-CM | POA: Diagnosis not present

## 2020-12-12 DIAGNOSIS — R7401 Elevation of levels of liver transaminase levels: Secondary | ICD-10-CM | POA: Diagnosis not present

## 2020-12-14 DIAGNOSIS — I10 Essential (primary) hypertension: Secondary | ICD-10-CM | POA: Diagnosis not present

## 2020-12-14 DIAGNOSIS — Z566 Other physical and mental strain related to work: Secondary | ICD-10-CM | POA: Diagnosis not present

## 2020-12-14 DIAGNOSIS — H538 Other visual disturbances: Secondary | ICD-10-CM | POA: Diagnosis not present

## 2020-12-14 DIAGNOSIS — R000 Tachycardia, unspecified: Secondary | ICD-10-CM | POA: Diagnosis not present

## 2020-12-15 DIAGNOSIS — I10 Essential (primary) hypertension: Secondary | ICD-10-CM | POA: Diagnosis not present

## 2020-12-15 DIAGNOSIS — F430 Acute stress reaction: Secondary | ICD-10-CM | POA: Diagnosis not present

## 2020-12-15 DIAGNOSIS — F419 Anxiety disorder, unspecified: Secondary | ICD-10-CM | POA: Diagnosis not present

## 2020-12-15 DIAGNOSIS — F439 Reaction to severe stress, unspecified: Secondary | ICD-10-CM | POA: Diagnosis not present

## 2020-12-16 DIAGNOSIS — F419 Anxiety disorder, unspecified: Secondary | ICD-10-CM | POA: Diagnosis not present

## 2020-12-16 DIAGNOSIS — G4700 Insomnia, unspecified: Secondary | ICD-10-CM | POA: Diagnosis not present

## 2020-12-16 DIAGNOSIS — I10 Essential (primary) hypertension: Secondary | ICD-10-CM | POA: Diagnosis not present

## 2020-12-16 DIAGNOSIS — R0600 Dyspnea, unspecified: Secondary | ICD-10-CM | POA: Diagnosis not present

## 2020-12-16 DIAGNOSIS — R1033 Periumbilical pain: Secondary | ICD-10-CM | POA: Diagnosis not present

## 2020-12-16 DIAGNOSIS — N951 Menopausal and female climacteric states: Secondary | ICD-10-CM | POA: Diagnosis not present

## 2020-12-16 DIAGNOSIS — R002 Palpitations: Secondary | ICD-10-CM | POA: Diagnosis not present

## 2020-12-16 DIAGNOSIS — R5381 Other malaise: Secondary | ICD-10-CM | POA: Diagnosis not present

## 2020-12-17 DIAGNOSIS — R0600 Dyspnea, unspecified: Secondary | ICD-10-CM | POA: Diagnosis not present

## 2020-12-17 DIAGNOSIS — R1033 Periumbilical pain: Secondary | ICD-10-CM | POA: Diagnosis not present

## 2020-12-17 DIAGNOSIS — R002 Palpitations: Secondary | ICD-10-CM | POA: Diagnosis not present

## 2020-12-17 DIAGNOSIS — R5381 Other malaise: Secondary | ICD-10-CM | POA: Diagnosis not present

## 2021-01-07 ENCOUNTER — Encounter: Payer: Self-pay | Admitting: Internal Medicine

## 2021-01-08 DIAGNOSIS — K589 Irritable bowel syndrome without diarrhea: Secondary | ICD-10-CM | POA: Diagnosis not present

## 2021-01-08 DIAGNOSIS — R0600 Dyspnea, unspecified: Secondary | ICD-10-CM | POA: Diagnosis not present

## 2021-01-08 DIAGNOSIS — E785 Hyperlipidemia, unspecified: Secondary | ICD-10-CM | POA: Diagnosis not present

## 2021-01-08 DIAGNOSIS — R7401 Elevation of levels of liver transaminase levels: Secondary | ICD-10-CM | POA: Diagnosis not present

## 2021-01-08 DIAGNOSIS — R5381 Other malaise: Secondary | ICD-10-CM | POA: Diagnosis not present

## 2021-01-08 DIAGNOSIS — H8113 Benign paroxysmal vertigo, bilateral: Secondary | ICD-10-CM | POA: Diagnosis not present

## 2021-01-08 DIAGNOSIS — R7309 Other abnormal glucose: Secondary | ICD-10-CM | POA: Diagnosis not present

## 2021-01-08 DIAGNOSIS — R42 Dizziness and giddiness: Secondary | ICD-10-CM | POA: Diagnosis not present

## 2021-02-05 ENCOUNTER — Other Ambulatory Visit (HOSPITAL_COMMUNITY): Payer: Self-pay

## 2021-02-05 MED ORDER — CARESTART COVID-19 HOME TEST VI KIT
PACK | 0 refills | Status: DC
  Filled 2021-02-05: qty 4, 4d supply, fill #0

## 2021-02-06 ENCOUNTER — Other Ambulatory Visit (HOSPITAL_COMMUNITY): Payer: Self-pay

## 2021-02-06 MED ORDER — PREDNISONE 10 MG (21) PO TBPK: ORAL_TABLET | ORAL | 0 refills | Status: DC

## 2021-02-06 MED ORDER — TIZANIDINE HCL 4 MG PO TABS: ORAL_TABLET | ORAL | 0 refills | Status: DC

## 2021-02-06 MED ORDER — TELMISARTAN 40 MG PO TABS: ORAL_TABLET | ORAL | 1 refills | Status: DC

## 2021-02-06 MED ORDER — NORTRIPTYLINE HCL 10 MG PO CAPS: ORAL_CAPSULE | ORAL | 5 refills | Status: DC

## 2021-02-06 MED ORDER — LOSARTAN POTASSIUM 50 MG PO TABS: ORAL_TABLET | ORAL | 0 refills | Status: DC

## 2021-02-06 MED ORDER — LOPERAMIDE HCL 2 MG PO TABS: ORAL_TABLET | ORAL | 0 refills | Status: DC

## 2021-02-06 MED ORDER — FAMOTIDINE 40 MG PO TABS: ORAL_TABLET | ORAL | 4 refills | Status: DC

## 2021-02-06 MED ORDER — LANSOPRAZOLE 30 MG PO CPDR
DELAYED_RELEASE_CAPSULE | ORAL | 3 refills | Status: DC
  Filled 2021-02-06: qty 90, 90d supply, fill #0

## 2021-02-06 MED ORDER — PROPRANOLOL HCL 20 MG PO TABS: ORAL_TABLET | ORAL | 5 refills | Status: DC

## 2021-02-14 ENCOUNTER — Encounter: Payer: Self-pay | Admitting: *Deleted

## 2021-02-17 ENCOUNTER — Ambulatory Visit: Payer: 59 | Admitting: Internal Medicine

## 2021-02-17 ENCOUNTER — Encounter: Payer: Self-pay | Admitting: *Deleted

## 2021-02-21 ENCOUNTER — Ambulatory Visit: Payer: 59 | Admitting: Nurse Practitioner

## 2021-02-26 ENCOUNTER — Other Ambulatory Visit (HOSPITAL_COMMUNITY): Payer: Self-pay

## 2021-02-26 MED ORDER — VERAPAMIL HCL ER 120 MG PO CP24
ORAL_CAPSULE | ORAL | 0 refills | Status: DC
  Filled 2021-02-26: qty 36, 25d supply, fill #0

## 2021-02-27 ENCOUNTER — Other Ambulatory Visit (HOSPITAL_COMMUNITY): Payer: Self-pay

## 2021-03-04 ENCOUNTER — Other Ambulatory Visit (HOSPITAL_COMMUNITY): Payer: Self-pay

## 2021-03-05 ENCOUNTER — Other Ambulatory Visit (HOSPITAL_COMMUNITY): Payer: Self-pay

## 2021-03-07 ENCOUNTER — Other Ambulatory Visit (HOSPITAL_COMMUNITY): Payer: Self-pay

## 2021-03-08 ENCOUNTER — Other Ambulatory Visit (HOSPITAL_COMMUNITY): Payer: Self-pay

## 2021-03-08 MED ORDER — VERAPAMIL HCL ER 120 MG PO CP24
ORAL_CAPSULE | ORAL | 0 refills | Status: DC
Start: 2021-03-07 — End: 2021-04-16
  Filled 2021-03-08: qty 180, 90d supply, fill #0

## 2021-03-14 DIAGNOSIS — R5381 Other malaise: Secondary | ICD-10-CM | POA: Diagnosis not present

## 2021-03-14 DIAGNOSIS — R7309 Other abnormal glucose: Secondary | ICD-10-CM | POA: Diagnosis not present

## 2021-03-14 DIAGNOSIS — R7401 Elevation of levels of liver transaminase levels: Secondary | ICD-10-CM | POA: Diagnosis not present

## 2021-03-14 DIAGNOSIS — K589 Irritable bowel syndrome without diarrhea: Secondary | ICD-10-CM | POA: Diagnosis not present

## 2021-03-14 DIAGNOSIS — Z6837 Body mass index (BMI) 37.0-37.9, adult: Secondary | ICD-10-CM | POA: Diagnosis not present

## 2021-03-14 DIAGNOSIS — R1033 Periumbilical pain: Secondary | ICD-10-CM | POA: Diagnosis not present

## 2021-03-14 DIAGNOSIS — R202 Paresthesia of skin: Secondary | ICD-10-CM | POA: Diagnosis not present

## 2021-03-14 DIAGNOSIS — R079 Chest pain, unspecified: Secondary | ICD-10-CM | POA: Diagnosis not present

## 2021-03-16 DIAGNOSIS — R002 Palpitations: Secondary | ICD-10-CM | POA: Diagnosis not present

## 2021-03-16 DIAGNOSIS — I10 Essential (primary) hypertension: Secondary | ICD-10-CM | POA: Diagnosis not present

## 2021-03-17 ENCOUNTER — Other Ambulatory Visit (HOSPITAL_COMMUNITY): Payer: Self-pay

## 2021-03-18 ENCOUNTER — Other Ambulatory Visit (HOSPITAL_COMMUNITY): Payer: Self-pay

## 2021-03-18 DIAGNOSIS — R002 Palpitations: Secondary | ICD-10-CM | POA: Diagnosis not present

## 2021-03-19 ENCOUNTER — Other Ambulatory Visit (HOSPITAL_COMMUNITY): Payer: Self-pay

## 2021-03-21 DIAGNOSIS — F411 Generalized anxiety disorder: Secondary | ICD-10-CM | POA: Diagnosis not present

## 2021-03-21 DIAGNOSIS — F330 Major depressive disorder, recurrent, mild: Secondary | ICD-10-CM | POA: Diagnosis not present

## 2021-03-25 DIAGNOSIS — Z79899 Other long term (current) drug therapy: Secondary | ICD-10-CM | POA: Diagnosis not present

## 2021-03-25 DIAGNOSIS — I10 Essential (primary) hypertension: Secondary | ICD-10-CM | POA: Diagnosis not present

## 2021-03-25 DIAGNOSIS — R002 Palpitations: Secondary | ICD-10-CM | POA: Diagnosis not present

## 2021-03-25 DIAGNOSIS — R0602 Shortness of breath: Secondary | ICD-10-CM | POA: Diagnosis not present

## 2021-03-25 DIAGNOSIS — R0600 Dyspnea, unspecified: Secondary | ICD-10-CM | POA: Diagnosis not present

## 2021-03-27 ENCOUNTER — Other Ambulatory Visit (HOSPITAL_COMMUNITY): Payer: Self-pay

## 2021-03-28 DIAGNOSIS — K589 Irritable bowel syndrome without diarrhea: Secondary | ICD-10-CM | POA: Diagnosis not present

## 2021-03-28 DIAGNOSIS — R202 Paresthesia of skin: Secondary | ICD-10-CM | POA: Diagnosis not present

## 2021-03-28 DIAGNOSIS — Z6837 Body mass index (BMI) 37.0-37.9, adult: Secondary | ICD-10-CM | POA: Diagnosis not present

## 2021-03-28 DIAGNOSIS — F411 Generalized anxiety disorder: Secondary | ICD-10-CM | POA: Diagnosis not present

## 2021-03-28 DIAGNOSIS — R079 Chest pain, unspecified: Secondary | ICD-10-CM | POA: Diagnosis not present

## 2021-03-28 DIAGNOSIS — R1033 Periumbilical pain: Secondary | ICD-10-CM | POA: Diagnosis not present

## 2021-03-28 DIAGNOSIS — R5381 Other malaise: Secondary | ICD-10-CM | POA: Diagnosis not present

## 2021-03-28 DIAGNOSIS — G4700 Insomnia, unspecified: Secondary | ICD-10-CM | POA: Diagnosis not present

## 2021-03-28 DIAGNOSIS — F330 Major depressive disorder, recurrent, mild: Secondary | ICD-10-CM | POA: Diagnosis not present

## 2021-03-31 ENCOUNTER — Other Ambulatory Visit (HOSPITAL_COMMUNITY): Payer: Self-pay

## 2021-03-31 MED ORDER — VILAZODONE HCL 10 MG PO TABS
10.0000 mg | ORAL_TABLET | Freq: Every day | ORAL | 0 refills | Status: DC
  Filled 2021-03-31: qty 30, 30d supply, fill #0

## 2021-04-01 ENCOUNTER — Other Ambulatory Visit (HOSPITAL_COMMUNITY): Payer: Self-pay

## 2021-04-01 MED ORDER — CARESTART COVID-19 HOME TEST VI KIT
PACK | 0 refills | Status: DC
  Filled 2021-04-01: qty 4, 4d supply, fill #0

## 2021-04-02 ENCOUNTER — Other Ambulatory Visit (HOSPITAL_COMMUNITY): Payer: Self-pay

## 2021-04-02 MED ORDER — AMLODIPINE BESYLATE 2.5 MG PO TABS
ORAL_TABLET | ORAL | 11 refills | Status: DC
  Filled 2021-04-02: qty 30, 30d supply, fill #0

## 2021-04-08 ENCOUNTER — Encounter: Payer: Self-pay | Admitting: Neurology

## 2021-04-14 DIAGNOSIS — Z1231 Encounter for screening mammogram for malignant neoplasm of breast: Secondary | ICD-10-CM | POA: Diagnosis not present

## 2021-04-16 ENCOUNTER — Ambulatory Visit (INDEPENDENT_AMBULATORY_CARE_PROVIDER_SITE_OTHER): Payer: 59 | Admitting: Gastroenterology

## 2021-04-16 ENCOUNTER — Other Ambulatory Visit (HOSPITAL_COMMUNITY): Payer: Self-pay

## 2021-04-16 ENCOUNTER — Encounter: Payer: Self-pay | Admitting: Gastroenterology

## 2021-04-16 VITALS — BP 148/76 | HR 84 | Ht 66.0 in | Wt 226.0 lb

## 2021-04-16 DIAGNOSIS — Z1212 Encounter for screening for malignant neoplasm of rectum: Secondary | ICD-10-CM | POA: Diagnosis not present

## 2021-04-16 DIAGNOSIS — Z1211 Encounter for screening for malignant neoplasm of colon: Secondary | ICD-10-CM | POA: Insufficient documentation

## 2021-04-16 DIAGNOSIS — K219 Gastro-esophageal reflux disease without esophagitis: Secondary | ICD-10-CM | POA: Diagnosis not present

## 2021-04-16 DIAGNOSIS — R002 Palpitations: Secondary | ICD-10-CM | POA: Diagnosis not present

## 2021-04-16 MED ORDER — NA SULFATE-K SULFATE-MG SULF 17.5-3.13-1.6 GM/177ML PO SOLN
1.0000 | Freq: Once | ORAL | 0 refills | Status: AC
  Filled 2021-04-16: qty 354, 1d supply, fill #0

## 2021-04-16 MED ORDER — FAMOTIDINE 20 MG PO TABS
20.0000 mg | ORAL_TABLET | Freq: Two times a day (BID) | ORAL | 2 refills | Status: DC
  Filled 2021-04-16: qty 60, 30d supply, fill #0

## 2021-04-16 NOTE — Patient Instructions (Signed)
If you are age 50 or younger, your body mass index should be between 19-25. Your Body mass index is 36.48 kg/m. If this is out of the aformentioned range listed, please consider follow up with your Primary Care Provider.  __________________________________________________________  The [facility] providers would like to encourage you to use [facility] to communicate with providers for non-urgent requests or questions.  Due to long hold times on the telephone, sending your provider a message by [facility] may be a faster and more efficient way to get a response.  Please allow 48 business hours for a response.  Please remember that this is for non-urgent requests.   You have been scheduled for an endoscopy and colonoscopy. Please follow the written instructions given to you at your visit today. Please pick up your prep supplies at the pharmacy within the next 1-3 days. If you use inhalers (even only as needed), please bring them with you on the day of your procedure.  START Famotidine 20 mg 1 tablet twice daily.  Janett Billow wanted you to have information to weight loss clinic. [facility]  [place], [place] [phone]  Follow up pending the results of your Endoscopy/Colonoscopy or as needed.  Thank you for entrusting me with your care and choosing [facility].  Alonza Bogus, PA-C

## 2021-04-16 NOTE — Progress Notes (Signed)
04/16/2021 Peggy Wilkerson [id number] [birth year]   HISTORY OF PRESENT ILLNESS: This is a 50 year old female who has past medical history of anxiety, GERD, hepatic steatosis, hypertension, suspected IBS.  She was seeing gastroenterology through [facility] up until 2020.  She tells me that she has had a couple of endoscopies in the past, she thinks the last was probably in 2014.  She tells me that she has never had a colonoscopy although it looks like at one point they had recommended one.  She says that she thinks that she probably has irritable bowel and has been given that presumptive diagnosis as her bowel habits tend to alternate with sometimes loose stools, sometimes constipation, but for the most part she moves her bowels regularly.  She denies seeing blood in her stool.  She takes Prevacid only for a couple weeks at a time as needed when her heartburn/indigestion gets more severe.  She says that she does not like to take pills.   Past Medical History:  Diagnosis Date   Anemia    Anxiety    GERD (gastroesophageal reflux disease)    Hepatic hemangioma    Hepatic steatosis    Hypertension    IBS (irritable bowel syndrome)    Past Surgical History:  Procedure Laterality Date   BREAST BIOPSY     CHOLECYSTECTOMY      reports that she has never smoked. She has never used smokeless tobacco. She reports that she does not drink alcohol and does not use drugs. family history includes Breast cancer in her paternal aunt; Cancer in her father; Diabetes in her maternal grandmother; Hypertension in her father. Allergies  Allergen Reactions   Doxycycline Other (See Comments)    Rapid HR Rapid HR    Minocycline Other (See Comments)    Rapid HR Rapid HR       Outpatient Encounter Medications as of 04/16/2021  Medication Sig   famotidine (PEPCID) 20 MG tablet Take 1 tablet (20 mg total) by mouth 2 (two) times daily.   lansoprazole (PREVACID) 30 MG capsule Take 1 capsules by mouth in the  morning   Na Sulfate-K Sulfate-Mg Sulf 17.5-3.13-1.6 GM/177ML SOLN Take 1 kit by mouth once for 1 dose.   [DISCONTINUED] amLODipine (NORVASC) 2.5 MG tablet Take 1 tablet (2.5 mg total) by mouth once daily   [DISCONTINUED] amoxicillin-clavulanate (AUGMENTIN) 875-125 MG tablet Take 1 tablet by mouth 2 (two) times daily. (Patient not taking: Reported on 05/16/2018)   [DISCONTINUED] COVID-19 At Home Antigen Test (CARESTART COVID-19 HOME TEST) KIT Use as directed   [DISCONTINUED] COVID-19 At Home Antigen Test (CARESTART COVID-19 HOME TEST) KIT Use as directed   [DISCONTINUED] COVID-19 At Home Antigen Test KIT USE AS DIRECTED   [DISCONTINUED] famotidine (PEPCID) 40 MG tablet Take 1 tablet by mouth at night (Patient taking differently: Take 40 mg by mouth daily.)   [DISCONTINUED] famotidine (PEPCID) 40 MG tablet Take 1 tablet by mouth at night   [DISCONTINUED] fluticasone (FLONASE) 50 MCG/ACT nasal spray Place 1 spray into both nostrils 2 (two) times daily as needed for allergies or rhinitis.   [DISCONTINUED] fluticasone (FLONASE) 50 MCG/ACT nasal spray Place 2 sprays into both nostrils daily. (Patient not taking: Reported on 04/04/2020)   [DISCONTINUED] loperamide (ANTI-DIARRHEAL) 2 MG tablet Take 1 to 2 tablets by mouth 3 times a day as needed   [DISCONTINUED] losartan (COZAAR) 50 MG tablet Take 1 tablet by mouth daily   [DISCONTINUED] meclizine (ANTIVERT) 25 MG tablet Take 1 tablet (25  mg total) by mouth 3 (three) times daily as needed for dizziness. (Patient not taking: Reported on 04/04/2020)   [DISCONTINUED] nortriptyline (PAMELOR) 10 MG capsule Take 1 capsule by mouth every night for 1 week then take 2 capsules by mouth every night at bedtime   [DISCONTINUED] pantoprazole (PROTONIX) 40 MG tablet Take 1 tablet (40 mg total) by mouth daily.   [DISCONTINUED] predniSONE (STERAPRED UNI-PAK 21 TAB) 10 MG (21) TBPK tablet Use as directed on package   [DISCONTINUED] propranolol (INDERAL) 20 MG tablet Take 1/2  tablet by mouth twice a day for 1 week then increase to 1 tablet by mouth twice a day   [DISCONTINUED] telmisartan (MICARDIS) 40 MG tablet Take 1/2 to 1 tablet by mouth daily   [DISCONTINUED] tiZANidine (ZANAFLEX) 4 MG tablet Take 1/2 to 1 tablet by mouth in the evening   [DISCONTINUED] verapamil (VERELAN PM) 120 MG 24 hr capsule Take 1 capsule by mouth 2 times a day   [DISCONTINUED] verapamil (VERELAN) 100 MG 24 hr capsule Take 100 mg by mouth daily.   [DISCONTINUED] Vilazodone HCl (VIIBRYD) 10 MG TABS Take 1 tablet (10 mg total) by mouth daily as directed.   No facility-administered encounter medications on file as of 04/16/2021.     REVIEW OF SYSTEMS  : All other systems reviewed and negative except where noted in the History of Present Illness.   PHYSICAL EXAM: BP (!) 148/76   Pulse 84   Ht 5' 6"  (1.676 m)   Wt 226 lb (102.5 kg)   BMI 36.48 kg/m  General: Well developed white female in no acute distress Head: Normocephalic and atraumatic Eyes:  Sclerae anicteric, conjunctiva pink. Ears: Normal auditory acuity Lungs: Clear throughout to auscultation; no W/R/R. Heart: Regular rate and rhythm; no M/R/G. Abdomen: Soft, non-distended.  BS present.  Non-tender. Rectal:  Will be done at the time of colonoscopy. Musculoskeletal: Symmetrical with no gross deformities  Skin: No lesions on visible extremities Extremities: No edema  Neurological: Alert oriented x 4, grossly non-focal Psychological:  Alert and cooperative. Normal mood and affect  ASSESSMENT AND PLAN: *CRC screening:  Never had colonoscopy in the past.  Will schedule with Dr. Candis Schatz.  *GERD:  Reports having two EGDs in the past, the last several years ago, maybe 2014.  Only takes her Prevacid for short courses at a time as needed when symptoms are worse.  She would like to have another EGD at the time of her colonoscopy.  We also discussed using Pepcid on an as-needed basis as well, which is faster acting.  Prescription  sent to pharmacy  **The risks, benefits, and alternatives to EGD and colonoscopy were discussed with the patient and she consents to proceed.   CC:  Anne Ng, MD

## 2021-04-17 NOTE — Progress Notes (Signed)
Agree with above assessment and plan 

## 2021-04-21 DIAGNOSIS — Z760 Encounter for issue of repeat prescription: Secondary | ICD-10-CM | POA: Diagnosis not present

## 2021-04-22 DIAGNOSIS — R42 Dizziness and giddiness: Secondary | ICD-10-CM | POA: Diagnosis not present

## 2021-04-23 ENCOUNTER — Other Ambulatory Visit (HOSPITAL_COMMUNITY): Payer: Self-pay

## 2021-04-23 MED ORDER — ONDANSETRON 8 MG PO TBDP
ORAL_TABLET | ORAL | 2 refills | Status: DC
  Filled 2021-04-23: qty 12, 3d supply, fill #0

## 2021-04-23 MED ORDER — TRIAMTERENE-HCTZ 37.5-25 MG PO CAPS
ORAL_CAPSULE | ORAL | 11 refills | Status: DC
  Filled 2021-04-23 – 2021-05-22 (×2): qty 30, 30d supply, fill #0

## 2021-04-23 MED ORDER — MECLIZINE HCL 25 MG PO TABS
ORAL_TABLET | ORAL | 2 refills | Status: DC
  Filled 2021-04-23 – 2021-05-22 (×2): qty 30, 10d supply, fill #0

## 2021-04-25 ENCOUNTER — Other Ambulatory Visit (HOSPITAL_COMMUNITY): Payer: Self-pay

## 2021-04-30 ENCOUNTER — Other Ambulatory Visit (HOSPITAL_COMMUNITY): Payer: Self-pay

## 2021-05-02 NOTE — Progress Notes (Deleted)
NEUROLOGY CONSULTATION NOTE  Peggy Wilkerson MRN: [id number] DOB: Nov 04, 1970  Referring provider: Sheppard Evens, MD Primary care provider: Sheppard Evens, MD  Reason for consult:  dizziness, neuralgia  Assessment/Plan:   ***   Subjective:  Peggy Wilkerson is a 50 year old female with HTN, GERD, hepatic steatosis and IBS who presents for dizziness and neuralgia.  History supplemented by referring provider's note.  She started experiencing dizziness in June.  She describes it as ***.  She feels off balance.  She also notes blurred vision, muscle stiffness and palpitations.  She has ***.  The symptoms are persistent.  Sometimes she feels like that she may pass out.  She also sometimes feels that her face is numb and that she has tingling in her fingers.  She reports other symptoms such as chest pain, abdominal pain and fatigue.  Her blood pressure had been elevated, as high as 171/101.  CBC, CMP and TSH were unremarkable.  Hgb A1c was 5.3.  Troponin was negative.  She had a holter monitor which demonstrated sinus rhythm (45 to 125 bpm) with PACs.  She was started on Viibryd for anxiety.       PAST MEDICAL HISTORY: Past Medical History:  Diagnosis Date   Anemia    Anxiety    GERD (gastroesophageal reflux disease)    Hepatic hemangioma    Hepatic steatosis    Hypertension    IBS (irritable bowel syndrome)     PAST SURGICAL HISTORY: Past Surgical History:  Procedure Laterality Date   BREAST BIOPSY     CHOLECYSTECTOMY      MEDICATIONS: Current Outpatient Medications on File Prior to Visit  Medication Sig Dispense Refill   famotidine (PEPCID) 20 MG tablet Take 1 tablet (20 mg total) by mouth 2 (two) times daily. 60 tablet 2   lansoprazole (PREVACID) 30 MG capsule Take 1 capsules by mouth in the morning 90 capsule 3   meclizine (ANTIVERT) 25 MG tablet Take 1/2-1 tablet by mouth 3 times a day as needed for vertigo or severe dizziness 30 tablet 2   ondansetron (ZOFRAN-ODT) 8 MG  disintegrating tablet Dissolve 1 tablet by mouth every 6 hours as needed 12 tablet 2   triamterene-hydrochlorothiazide (DYAZIDE) 37.5-25 MG capsule Take 1 capsule by mouth daily 30 capsule 11   No current facility-administered medications on file prior to visit.    ALLERGIES: Allergies  Allergen Reactions   Doxycycline Other (See Comments)    Rapid HR Rapid HR    Minocycline Other (See Comments)    Rapid HR Rapid HR     FAMILY HISTORY: Family History  Problem Relation Age of Onset   Cancer Father    Hypertension Father    Breast cancer Paternal Aunt    Diabetes Maternal Grandmother     Objective:  *** General: No acute distress.  Patient appears well-groomed.   Head:  Normocephalic/atraumatic Eyes:  fundi examined but not visualized Neck: supple, no paraspinal tenderness, full range of motion Back: No paraspinal tenderness Heart: regular rate and rhythm Lungs: Clear to auscultation bilaterally. Vascular: No carotid bruits. Neurological Exam: Mental status: alert and oriented to person, place, and time, recent and remote memory intact, fund of knowledge intact, attention and concentration intact, speech fluent and not dysarthric, language intact. Cranial nerves: CN I: not tested CN II: pupils equal, round and reactive to light, visual fields intact CN III, IV, VI:  full range of motion, no nystagmus, no ptosis CN V: facial sensation intact. CN VII: upper and  lower face symmetric CN VIII: hearing intact CN IX, X: gag intact, uvula midline CN XI: sternocleidomastoid and trapezius muscles intact CN XII: tongue midline Bulk & Tone: normal, no fasciculations. Motor:  muscle strength 5/5 throughout Sensation:  Pinprick, temperature and vibratory sensation intact. Deep Tendon Reflexes:  2+ throughout,  toes downgoing.   Finger to nose testing:  Without dysmetria.   Heel to shin:  Without dysmetria.   Gait:  Normal station and stride.  Romberg negative.    Thank you  for allowing me to take part in the care of this patient.  Metta Clines, DO  CC: Sheppard Evens, MD

## 2021-05-05 ENCOUNTER — Ambulatory Visit: Payer: 59 | Admitting: Neurology

## 2021-05-09 ENCOUNTER — Encounter: Payer: 59 | Admitting: Gastroenterology

## 2021-05-09 DIAGNOSIS — J36 Peritonsillar abscess: Secondary | ICD-10-CM | POA: Diagnosis not present

## 2021-05-09 DIAGNOSIS — J029 Acute pharyngitis, unspecified: Secondary | ICD-10-CM | POA: Diagnosis not present

## 2021-05-20 DIAGNOSIS — Z124 Encounter for screening for malignant neoplasm of cervix: Secondary | ICD-10-CM | POA: Diagnosis not present

## 2021-05-20 DIAGNOSIS — Z01419 Encounter for gynecological examination (general) (routine) without abnormal findings: Secondary | ICD-10-CM | POA: Diagnosis not present

## 2021-05-20 DIAGNOSIS — N926 Irregular menstruation, unspecified: Secondary | ICD-10-CM | POA: Diagnosis not present

## 2021-05-22 ENCOUNTER — Other Ambulatory Visit (HOSPITAL_BASED_OUTPATIENT_CLINIC_OR_DEPARTMENT_OTHER): Payer: Self-pay

## 2021-05-22 ENCOUNTER — Other Ambulatory Visit (HOSPITAL_COMMUNITY): Payer: Self-pay

## 2021-05-22 MED ORDER — WEGOVY 0.25 MG/0.5ML ~~LOC~~ SOAJ: SUBCUTANEOUS | 0 refills | Status: DC

## 2021-05-22 MED ORDER — SAXENDA 18 MG/3ML ~~LOC~~ SOPN
PEN_INJECTOR | SUBCUTANEOUS | 0 refills | Status: DC
  Filled 2021-05-22 – 2021-05-26 (×2): qty 12, 46d supply, fill #0

## 2021-05-23 ENCOUNTER — Other Ambulatory Visit (HOSPITAL_COMMUNITY): Payer: Self-pay

## 2021-05-26 ENCOUNTER — Other Ambulatory Visit (HOSPITAL_COMMUNITY): Payer: Self-pay

## 2021-05-28 ENCOUNTER — Other Ambulatory Visit (HOSPITAL_COMMUNITY): Payer: Self-pay

## 2021-05-29 ENCOUNTER — Other Ambulatory Visit (HOSPITAL_COMMUNITY): Payer: Self-pay

## 2021-05-29 MED ORDER — UNIFINE PENTIPS 32G X 4 MM MISC
0 refills | Status: DC
  Filled 2021-05-29: qty 100, 90d supply, fill #0

## 2021-06-11 DIAGNOSIS — Z30430 Encounter for insertion of intrauterine contraceptive device: Secondary | ICD-10-CM | POA: Diagnosis not present

## 2021-06-12 ENCOUNTER — Other Ambulatory Visit (HOSPITAL_COMMUNITY): Payer: Self-pay

## 2021-06-12 MED ORDER — ONDANSETRON 8 MG PO TBDP
ORAL_TABLET | ORAL | 1 refills | Status: DC
  Filled 2021-06-12: qty 30, 30d supply, fill #0

## 2021-06-13 ENCOUNTER — Other Ambulatory Visit (HOSPITAL_COMMUNITY): Payer: Self-pay

## 2021-07-02 ENCOUNTER — Other Ambulatory Visit (HOSPITAL_COMMUNITY): Payer: Self-pay

## 2021-07-02 MED ORDER — NEBIVOLOL HCL 2.5 MG PO TABS
2.5000 mg | ORAL_TABLET | Freq: Every day | ORAL | 11 refills | Status: DC
  Filled 2021-07-02: qty 90, 90d supply, fill #0

## 2021-07-02 MED ORDER — NEBIVOLOL HCL 2.5 MG PO TABS
2.5000 mg | ORAL_TABLET | Freq: Every day | ORAL | 11 refills | Status: DC
Start: 2021-03-18 — End: 2022-01-15
  Filled 2021-07-02: qty 90, 90d supply, fill #0
  Filled 2021-07-03: qty 90, 90d supply, fill #1

## 2021-07-03 ENCOUNTER — Other Ambulatory Visit (HOSPITAL_COMMUNITY): Payer: Self-pay

## 2021-07-21 DIAGNOSIS — T8332XA Displacement of intrauterine contraceptive device, initial encounter: Secondary | ICD-10-CM | POA: Diagnosis not present

## 2021-07-21 DIAGNOSIS — N926 Irregular menstruation, unspecified: Secondary | ICD-10-CM | POA: Diagnosis not present

## 2021-07-21 DIAGNOSIS — N920 Excessive and frequent menstruation with regular cycle: Secondary | ICD-10-CM | POA: Diagnosis not present

## 2021-08-08 ENCOUNTER — Other Ambulatory Visit (HOSPITAL_COMMUNITY): Payer: Self-pay

## 2021-08-08 DIAGNOSIS — I10 Essential (primary) hypertension: Secondary | ICD-10-CM | POA: Diagnosis not present

## 2021-08-08 DIAGNOSIS — K219 Gastro-esophageal reflux disease without esophagitis: Secondary | ICD-10-CM | POA: Diagnosis not present

## 2021-08-08 DIAGNOSIS — E669 Obesity, unspecified: Secondary | ICD-10-CM | POA: Diagnosis not present

## 2021-08-08 MED ORDER — CHLORTHALIDONE 25 MG PO TABS
12.5000 mg | ORAL_TABLET | Freq: Every day | ORAL | 0 refills | Status: DC
  Filled 2021-08-08: qty 45, 90d supply, fill #0

## 2021-08-12 ENCOUNTER — Other Ambulatory Visit (HOSPITAL_COMMUNITY): Payer: Self-pay

## 2021-08-14 ENCOUNTER — Other Ambulatory Visit (HOSPITAL_COMMUNITY): Payer: Self-pay

## 2021-08-15 ENCOUNTER — Other Ambulatory Visit (HOSPITAL_COMMUNITY): Payer: Self-pay

## 2021-08-15 MED ORDER — NEBIVOLOL HCL 10 MG PO TABS
10.0000 mg | ORAL_TABLET | Freq: Every day | ORAL | 0 refills | Status: DC
  Filled 2021-08-15: qty 90, 90d supply, fill #0

## 2021-08-16 ENCOUNTER — Other Ambulatory Visit (HOSPITAL_COMMUNITY): Payer: Self-pay

## 2021-08-18 ENCOUNTER — Other Ambulatory Visit (HOSPITAL_COMMUNITY): Payer: Self-pay

## 2021-08-22 ENCOUNTER — Other Ambulatory Visit (HOSPITAL_COMMUNITY): Payer: Self-pay

## 2021-08-22 DIAGNOSIS — E669 Obesity, unspecified: Secondary | ICD-10-CM | POA: Diagnosis not present

## 2021-08-22 DIAGNOSIS — I10 Essential (primary) hypertension: Secondary | ICD-10-CM | POA: Diagnosis not present

## 2021-08-22 MED ORDER — SAXENDA 18 MG/3ML ~~LOC~~ SOPN
PEN_INJECTOR | SUBCUTANEOUS | 3 refills | Status: DC
  Filled 2021-08-22: qty 45, 90d supply, fill #0

## 2021-09-02 ENCOUNTER — Other Ambulatory Visit (HOSPITAL_COMMUNITY): Payer: Self-pay

## 2021-09-05 ENCOUNTER — Other Ambulatory Visit (HOSPITAL_COMMUNITY): Payer: Self-pay

## 2021-09-05 MED ORDER — CARESTART COVID-19 HOME TEST VI KIT
PACK | 0 refills | Status: DC
  Filled 2021-09-05: qty 4, 4d supply, fill #0

## 2021-09-08 ENCOUNTER — Other Ambulatory Visit (HOSPITAL_COMMUNITY): Payer: Self-pay

## 2021-09-11 ENCOUNTER — Other Ambulatory Visit (HOSPITAL_COMMUNITY): Payer: Self-pay

## 2021-09-11 DIAGNOSIS — I10 Essential (primary) hypertension: Secondary | ICD-10-CM | POA: Diagnosis not present

## 2021-09-11 DIAGNOSIS — J069 Acute upper respiratory infection, unspecified: Secondary | ICD-10-CM | POA: Diagnosis not present

## 2021-09-11 MED ORDER — LISINOPRIL 5 MG PO TABS
ORAL_TABLET | ORAL | 0 refills | Status: DC
  Filled 2021-09-11: qty 30, 30d supply, fill #0

## 2021-09-19 ENCOUNTER — Other Ambulatory Visit (HOSPITAL_COMMUNITY): Payer: Self-pay

## 2021-09-19 DIAGNOSIS — I10 Essential (primary) hypertension: Secondary | ICD-10-CM | POA: Diagnosis not present

## 2021-09-19 MED ORDER — CHLORTHALIDONE 25 MG PO TABS
12.5000 mg | ORAL_TABLET | Freq: Every day | ORAL | 3 refills | Status: DC
  Filled 2021-09-19 – 2021-12-29 (×2): qty 45, 90d supply, fill #0

## 2021-09-19 MED ORDER — NEBIVOLOL HCL 5 MG PO TABS
5.0000 mg | ORAL_TABLET | Freq: Every day | ORAL | 3 refills | Status: DC
  Filled 2021-09-19: qty 90, 90d supply, fill #0
  Filled 2021-12-29: qty 90, 90d supply, fill #1

## 2021-11-18 ENCOUNTER — Other Ambulatory Visit (HOSPITAL_COMMUNITY): Payer: Self-pay

## 2021-12-29 ENCOUNTER — Other Ambulatory Visit (HOSPITAL_COMMUNITY): Payer: Self-pay

## 2022-01-08 ENCOUNTER — Ambulatory Visit (INDEPENDENT_AMBULATORY_CARE_PROVIDER_SITE_OTHER): Payer: 59

## 2022-01-08 ENCOUNTER — Encounter (HOSPITAL_COMMUNITY): Payer: Self-pay | Admitting: Emergency Medicine

## 2022-01-08 ENCOUNTER — Ambulatory Visit (HOSPITAL_COMMUNITY)
Admission: EM | Admit: 2022-01-08 | Discharge: 2022-01-08 | Disposition: A | Discharge status: Home / Self Care | Payer: 59 | Attending: Nurse Practitioner | Admitting: Nurse Practitioner

## 2022-01-08 DIAGNOSIS — R0602 Shortness of breath: Secondary | ICD-10-CM | POA: Diagnosis not present

## 2022-01-08 DIAGNOSIS — R0600 Dyspnea, unspecified: Secondary | ICD-10-CM

## 2022-01-08 MED ORDER — LEVALBUTEROL TARTRATE 45 MCG/ACT IN AERO
2.0000 | INHALATION_SPRAY | Freq: Four times a day (QID) | RESPIRATORY_TRACT | 1 refills | Status: AC | PRN
  Filled 2022-01-08: qty 15, 25d supply, fill #0

## 2022-01-08 MED ORDER — ALBUTEROL SULFATE (2.5 MG/3ML) 0.083% IN NEBU
INHALATION_SOLUTION | RESPIRATORY_TRACT | Status: AC
  Filled 2022-01-08: qty 3

## 2022-01-08 MED ORDER — ALBUTEROL SULFATE (2.5 MG/3ML) 0.083% IN NEBU: 2.5000 mg | INHALATION_SOLUTION | RESPIRATORY_TRACT | Status: DC

## 2022-01-08 MED ORDER — PANTOPRAZOLE SODIUM 40 MG PO TBEC
40.0000 mg | DELAYED_RELEASE_TABLET | Freq: Every day | ORAL | 0 refills | Status: DC
  Filled 2022-01-08: qty 30, 30d supply, fill #0

## 2022-01-08 MED ORDER — LEVALBUTEROL HCL 1.25 MG/3ML IN NEBU
1.2500 mg | INHALATION_SOLUTION | RESPIRATORY_TRACT | 12 refills | Status: AC | PRN
  Filled 2022-01-08: qty 75, 4d supply, fill #0

## 2022-01-08 NOTE — ED Triage Notes (Addendum)
Pt is present today with SOB.  Pt states that she recently had a URI last week and hasn't been able to take a full deep breath since this morning.  ?

## 2022-01-08 NOTE — Discharge Instructions (Addendum)
Your chest x-ray normal, and it shows that your lungs are clear.  Your EKG shows normal sinus rhythm. Because you are having symptoms, I would like for you to go to the ER for further evaluation.  ?. ? ?

## 2022-01-08 NOTE — ED Provider Notes (Signed)
?[facility] ? ? ? ?CSN: [id number] ?Arrival date & time: 01/08/22  1740 ? ? ?  ? ?History   ?Chief Complaint ?Chief Complaint  ?Patient presents with  ? Shortness of Breath  ? ? ?HPI ?Peggy Wilkerson is a 51 y.o. female.  ? ?The patient is a 51 year old female who presents for shortness of breath.  Patient states that she woke up this morning, and has felt worsening shortness of breath that has been persistent all day.  She states that she does feel a little better than she did this morning.  Shortness of breath persist when she is at rest and with exertion.  States that she had an upper respiratory infection about a week ago, but states that she felt like she got over that.  She denies chest pain, but does admit to "chest heaviness".  She states that she feels like she cannot take a full deep breath.  She denies nausea, vomiting, diaphoresis, neck or jaw pain.  The patient states that she takes Bystolic for hypertension, states that she cut her medication in half approximately 1 week ago.  She also states that she has pain in her back by her left shoulder blade.  She states that pain has been present for several months.  She states that pain is not associated with her breathing or activity.  She also states that last evening, she did eat a meal that had a lot of onions and some type of pickle, which she states does aggravate her reflux.  She currently does not take any medication for her reflux symptoms.  Does have a history of anxiety, IBS.  She denies history of smoking. ? ? ?Shortness of Breath ?Severity:  Moderate ?Onset quality:  Sudden ?Chronicity:  New ?Context: activity and URI   ?Associated symptoms: chest pain ("chest heaviness")   ?Associated symptoms: no cough and no wheezing   ? ?Past Medical History:  ?Diagnosis Date  ? Anemia   ? Anxiety   ? GERD (gastroesophageal reflux disease)   ? Hepatic hemangioma   ? Hepatic steatosis   ? Hypertension   ? IBS (irritable bowel syndrome)   ? ? ?Patient Active  Problem List  ? Diagnosis Date Noted  ? Gastroesophageal reflux disease 04/16/2021  ? Encounter for colorectal cancer screening 04/16/2021  ? ? ?Past Surgical History:  ?Procedure Laterality Date  ? BREAST BIOPSY    ? CHOLECYSTECTOMY    ? ? ?OB History   ?No obstetric history on file. ?  ? ? ? ?Home Medications   ? ?Prior to Admission medications   ?Medication Sig Start Date End Date Taking? Authorizing Provider  ?levalbuterol [facility] HFA) 45 MCG/ACT inhaler Inhale 2 puffs into the lungs every 6 (six) hours as needed for shortness of breath. 01/08/22 02/07/22 Yes Daly Whipkey-Warren, Alda Lea, NP  ?levalbuterol Penne Lash) 1.25 MG/3ML nebulizer solution Take 1.25 mg by nebulization every 4 (four) hours as needed for wheezing. 01/08/22  Yes Eugune Sine-Warren, Alda Lea, NP  ?pantoprazole (PROTONIX) 40 MG tablet Take 1 tablet (40 mg total) by mouth daily. 01/08/22 02/07/22 Yes Avea Mcgowen-Warren, Alda Lea, NP  ?chlorthalidone (HYGROTON) 25 MG tablet Take 1/2 tablet by mouth once a day 08/08/21     ?chlorthalidone (HYGROTON) 25 MG tablet Take 1/2 tablet by mouth once a day 09/19/21     ?COVID-19 At Home Antigen Test [facility] COVID-19 HOME TEST) KIT Use as directed per package instructions 09/05/21   Edmon Crape, [facility]  ?famotidine (PEPCID) 20 MG tablet Take 1  tablet (20 mg total) by mouth 2 (two) times daily. 04/16/21   Zehr, Laban Emperor, PA-C  ?Insulin Pen Needle (UNIFINE PENTIPS) 32G X 4 MM MISC Use as directed with Saxenda once a day 05/29/21     ?lansoprazole (PREVACID) 30 MG capsule Take 1 capsules by mouth in the morning 12/17/20     ?Liraglutide -Weight Management (SAXENDA) 18 MG/3ML SOPN Inject 3 mg subcutaneously once a day 08/22/21     ?lisinopril (ZESTRIL) 5 MG tablet Take 1 tablet by mouth once a day as directed 09/11/21     ?meclizine (ANTIVERT) 25 MG tablet Take 1/2-1 tablet by mouth 3 times a day as needed for vertigo or severe dizziness 04/23/21     ?nebivolol (BYSTOLIC) 10 MG tablet Take 1 tablet by mouth once a day. 08/15/21      ?nebivolol (BYSTOLIC) 2.5 MG tablet Take 1 tablet (2.5 mg total) by mouth daily. 03/18/21     ?nebivolol (BYSTOLIC) 5 MG tablet Take 1 tablet by mouth once a day 09/19/21     ?ondansetron (ZOFRAN-ODT) 8 MG disintegrating tablet Dissolve 1 tablet by mouth every 6 hours as needed 04/23/21     ?ondansetron (ZOFRAN-ODT) 8 MG disintegrating tablet Dissolve 1 tablet in mouth once daily if needed. 06/12/21     ?Semaglutide-Weight Management (WEGOVY) 0.25 MG/0.5ML SOAJ Inject 0.25 mg into the skin once a week. 05/22/21     ?Semaglutide-Weight Management (WEGOVY) 0.25 MG/0.5ML SOAJ Inject 0.25 mg into the skin once a week 05/22/21     ?triamterene-hydrochlorothiazide (DYAZIDE) 37.5-25 MG capsule Take 1 capsule by mouth daily 04/23/21     ? ? ?Family History ?Family History  ?Problem Relation Age of Onset  ? Cancer Father   ? Hypertension Father   ? Breast cancer Paternal Aunt   ? Diabetes Maternal Grandmother   ? ? ?Social History ?Social History  ? ?Tobacco Use  ? Smoking status: Never  ? Smokeless tobacco: Never  ?Vaping Use  ? Vaping Use: Never used  ?Substance Use Topics  ? Alcohol use: Never  ? Drug use: Never  ? ? ? ?Allergies   ?Doxycycline and Minocycline ? ? ?Review of Systems ?Review of Systems  ?Constitutional: Negative.   ?Respiratory:  Positive for chest tightness and shortness of breath. Negative for cough and wheezing.   ?Cardiovascular:  Positive for chest pain ("chest heaviness"). Negative for palpitations.  ?Gastrointestinal: Negative.   ?Musculoskeletal:  Positive for back pain.  ?Skin: Negative.   ?Psychiatric/Behavioral: Negative.    ? ? ?Physical Exam ?Triage Vital Signs ?ED Triage Vitals  ?Enc Vitals Group  ?   BP 01/08/22 1820 (!) 163/95  ?   Pulse Rate 01/08/22 1820 95  ?   Resp 01/08/22 1820 18  ?   Temp 01/08/22 1820 98.6 ?F (37 ?C)  ?   Temp Source 01/08/22 1820 Oral  ?   SpO2 01/08/22 1820 99 %  ?   Weight --   ?   Height --   ?   Head Circumference --   ?   Peak Flow --   ?   Pain Score 01/08/22 1819  0  ?   Pain Loc --   ?   Pain Edu? --   ?   Excl. in [place]? --   ? ?No data found. ? ?Updated Vital Signs ?BP (!) 163/95   Pulse 95   Temp 98.6 ?F (37 ?C) (Oral)   Resp 18   SpO2 99%  ? ?Visual Acuity ?Right Eye  Distance:   ?Left Eye Distance:   ?Bilateral Distance:   ? ?Right Eye Near:   ?Left Eye Near:    ?Bilateral Near:    ? ?Physical Exam ?Vitals reviewed.  ?Constitutional:   ?   General: She is not in acute distress. ?   Appearance: She is well-developed.  ?HENT:  ?   Head: Normocephalic and atraumatic.  ?Cardiovascular:  ?   Rate and Rhythm: Normal rate and regular rhythm.  ?Pulmonary:  ?   Effort: Pulmonary effort is normal. No respiratory distress.  ?   Breath sounds: Normal breath sounds. No decreased breath sounds, wheezing, rhonchi or rales.  ?Musculoskeletal:  ?   Cervical back: Normal range of motion and neck supple.  ?     Back: ? ?   Comments: Tenderness to palpation between T-12 and T-13 in the left paraspinal region.   ?Skin: ?   General: Skin is warm and dry.  ?   Capillary Refill: Capillary refill takes less than 2 seconds.  ?Neurological:  ?   General: No focal deficit present.  ?   Mental Status: She is alert and oriented to person, place, and time.  ?Psychiatric:     ?   Mood and Affect: Mood normal.     ?   Behavior: Behavior normal.  ? ? ? ?UC Treatments / Results  ?Labs ?(all labs ordered are listed, but only abnormal results are displayed) ?Labs Reviewed - No data to display ? ?EKG ?NSR, no STEMI ? ?Radiology ?DG Chest 2 View ? ?Result Date: 01/08/2022 ?CLINICAL DATA:  Dyspnea EXAM: CHEST - 2 VIEW COMPARISON:  04/04/2020 FINDINGS: The heart size and mediastinal contours are within normal limits. Both lungs are clear. The visualized skeletal structures are unremarkable. IMPRESSION: No active cardiopulmonary disease. Electronically Signed   By: Fidela Salisbury M.D.   On: 01/08/2022 18:51   ? ?Procedures ?Procedures (including critical care time) ? ?Medications Ordered in UC ?Medications   ?albuterol (PROVENTIL) (2.5 MG/3ML) 0.083% nebulizer solution 2.5 mg (has no administration in time range)  ? ? ?Initial Impression / Assessment and Plan / UC Course  ?I have reviewed the triage vital signs an

## 2022-01-09 ENCOUNTER — Other Ambulatory Visit (HOSPITAL_COMMUNITY): Payer: Self-pay

## 2022-01-09 ENCOUNTER — Emergency Department (HOSPITAL_COMMUNITY)
Admission: EM | Admit: 2022-01-09 | Discharge: 2022-01-09 | Disposition: A | Discharge status: Home / Self Care | Payer: 59 | Attending: Emergency Medicine | Admitting: Emergency Medicine

## 2022-01-09 ENCOUNTER — Encounter (HOSPITAL_COMMUNITY): Payer: Self-pay | Admitting: Emergency Medicine

## 2022-01-09 ENCOUNTER — Emergency Department (HOSPITAL_COMMUNITY): Payer: 59

## 2022-01-09 DIAGNOSIS — Z20822 Contact with and (suspected) exposure to covid-19: Secondary | ICD-10-CM | POA: Insufficient documentation

## 2022-01-09 DIAGNOSIS — E876 Hypokalemia: Secondary | ICD-10-CM | POA: Insufficient documentation

## 2022-01-09 DIAGNOSIS — R5383 Other fatigue: Secondary | ICD-10-CM | POA: Diagnosis not present

## 2022-01-09 DIAGNOSIS — R0602 Shortness of breath: Secondary | ICD-10-CM | POA: Insufficient documentation

## 2022-01-09 DIAGNOSIS — D72829 Elevated white blood cell count, unspecified: Secondary | ICD-10-CM | POA: Diagnosis not present

## 2022-01-09 DIAGNOSIS — K449 Diaphragmatic hernia without obstruction or gangrene: Secondary | ICD-10-CM | POA: Diagnosis not present

## 2022-01-09 LAB — BASIC METABOLIC PANEL
Anion gap: 9 (ref 5–15)
BUN: 12 mg/dL (ref 6–20)
CO2: 27 mmol/L (ref 22–32)
Calcium: 8.9 mg/dL (ref 8.9–10.3)
Chloride: 103 mmol/L (ref 98–111)
Creatinine, Ser: 0.71 mg/dL (ref 0.44–1.00)
GFR, Estimated: >60 mL/min (ref >60)
Glucose, Bld: 71 mg/dL (ref 70–99)
Potassium: 3.1 mmol/L — ABNORMAL LOW (ref 3.5–5.1)
Sodium: 139 mmol/L (ref 135–145)

## 2022-01-09 LAB — CBC WITH DIFFERENTIAL/PLATELET
Abs Immature Granulocytes: 0.05 10*3/uL (ref 0.00–0.07)
Basophils Absolute: 0.1 10*3/uL (ref 0.0–0.1)
Basophils Relative: 0 %
Eosinophils Absolute: 0.1 10*3/uL (ref 0.0–0.5)
Eosinophils Relative: 1 %
HCT: 40.8 % (ref 36.0–46.0)
Hemoglobin: 12.8 g/dL (ref 12.0–15.0)
Immature Granulocytes: 0 %
Lymphocytes Relative: 24 %
Lymphs Abs: 2.8 10*3/uL (ref 0.7–4.0)
MCH: 29.3 pg (ref 26.0–34.0)
MCHC: 31.4 g/dL (ref 30.0–36.0)
MCV: 93.4 fL (ref 80.0–100.0)
Monocytes Absolute: 0.8 10*3/uL (ref 0.1–1.0)
Monocytes Relative: 7 %
Neutro Abs: 8 10*3/uL — ABNORMAL HIGH (ref 1.7–7.7)
Neutrophils Relative %: 68 %
Platelets: 254 10*3/uL (ref 150–400)
RBC: 4.37 MIL/uL (ref 3.87–5.11)
RDW: 13.2 % (ref 11.5–15.5)
WBC: 11.9 10*3/uL — ABNORMAL HIGH (ref 4.0–10.5)
nRBC: 0 % (ref 0.0–0.2)

## 2022-01-09 LAB — RESP PANEL BY RT-PCR (FLU A&B, COVID) ARPGX2
Influenza A by PCR: NEGATIVE
Influenza B by PCR: NEGATIVE
SARS Coronavirus 2 by RT PCR: NEGATIVE

## 2022-01-09 LAB — TROPONIN I (HIGH SENSITIVITY)
Troponin I (High Sensitivity): 2 ng/L (ref <18)
Troponin I (High Sensitivity): 3 ng/L (ref <18)

## 2022-01-09 LAB — D-DIMER, QUANTITATIVE: D-Dimer, Quant: 1.46 ug/mL-FEU — ABNORMAL HIGH (ref 0.00–0.50)

## 2022-01-09 MED ORDER — IOHEXOL 350 MG/ML SOLN
80.0000 mL | Freq: Once | INTRAVENOUS | Status: AC | PRN
  Administered 2022-01-09: 80 mL via INTRAVENOUS

## 2022-01-09 MED ORDER — SODIUM CHLORIDE 0.9 % IV BOLUS
1000.0000 mL | Freq: Once | INTRAVENOUS | Status: AC
  Administered 2022-01-09: 1000 mL via INTRAVENOUS

## 2022-01-09 NOTE — ED Provider Notes (Addendum)
?[facility] [address] DEPT ?Provider Note ? ? ?CSN: [id number] ?Arrival date & time: 01/09/22  1423 ? ?  ? ?History ? ?Chief Complaint  ?Patient presents with  ? Shortness of Breath  ? ? ?Peggy Wilkerson is a 51 y.o. female. ? ?51 year old female with no past medical history presents to the ED with a chief complaint of shortness of breath which began yesterday.  Patient was evaluated urgent care and referred to the ED for further evaluation.  However, patient reports she was unable to be seen yesterday.  She does states having a upper respiratory infection last week, feels that it is "hard for her to get a deep breath.  Did endorse some chest pressure along the substernal area without any radiation. ?Has not taking any medication for improvement in symptoms.  After her evaluation in urgent care yesterday, she thought this was likely reflux, prescribed Protonix however did not pick up the medication.  Did have an EKG at urgent care which was normal.  Denies any fever, cough, prior history of heart failure.  Patient is currently on Bystolic, HCTZ for blood pressure control. NO prio hx of CAD, no prior hx of blood clots.  ? ? ?The history is provided by the patient and medical records.  ?Shortness of Breath ?Severity:  Moderate ?Onset quality:  Gradual ?Duration:  1 day ?Timing:  Intermittent ?Progression:  Unchanged ?Chronicity:  New ?Associated symptoms: no abdominal pain, no chest pain, no fever, no sore throat and no vomiting   ? ?  ? ?Home Medications ?Prior to Admission medications   ?Medication Sig Start Date End Date Taking? Authorizing Provider  ?chlorthalidone (HYGROTON) 25 MG tablet Take 1/2 tablet by mouth once a day 08/08/21     ?chlorthalidone (HYGROTON) 25 MG tablet Take 1/2 tablet by mouth once a day 09/19/21     ?COVID-19 At Home Antigen Test [facility] COVID-19 HOME TEST) KIT Use as directed per package instructions 09/05/21   Edmon Crape, [facility]  ?famotidine (PEPCID) 20 MG tablet Take 1  tablet (20 mg total) by mouth 2 (two) times daily. 04/16/21   Zehr, Laban Emperor, PA-C  ?Insulin Pen Needle (UNIFINE PENTIPS) 32G X 4 MM MISC Use as directed with Saxenda once a day 05/29/21     ?lansoprazole (PREVACID) 30 MG capsule Take 1 capsules by mouth in the morning 12/17/20     ?levalbuterol (XOPENEX HFA) 45 MCG/ACT inhaler Inhale 2 puffs into the lungs every 6 (six) hours as needed for shortness of breath. 01/08/22 02/07/22  Leath-Warren, Alda Lea, NP  ?levalbuterol Penne Lash) 1.25 MG/3ML nebulizer solution Inhale 1 vial via nebulizer every 4 (four) hours as needed for wheezing. 01/08/22   Leath-Warren, Alda Lea, NP  ?Liraglutide -Weight Management (SAXENDA) 18 MG/3ML SOPN Inject 3 mg subcutaneously once a day 08/22/21     ?lisinopril (ZESTRIL) 5 MG tablet Take 1 tablet by mouth once a day as directed 09/11/21     ?meclizine (ANTIVERT) 25 MG tablet Take 1/2-1 tablet by mouth 3 times a day as needed for vertigo or severe dizziness 04/23/21     ?nebivolol (BYSTOLIC) 10 MG tablet Take 1 tablet by mouth once a day. 08/15/21     ?nebivolol (BYSTOLIC) 2.5 MG tablet Take 1 tablet (2.5 mg total) by mouth daily. 03/18/21     ?nebivolol (BYSTOLIC) 5 MG tablet Take 1 tablet by mouth once a day 09/19/21     ?ondansetron (ZOFRAN-ODT) 8 MG disintegrating tablet Dissolve 1 tablet by mouth every 6 hours as  needed 04/23/21     ?ondansetron (ZOFRAN-ODT) 8 MG disintegrating tablet Dissolve 1 tablet in mouth once daily if needed. 06/12/21     ?pantoprazole (PROTONIX) 40 MG tablet Take 1 tablet (40 mg total) by mouth daily. 01/08/22 02/08/22  Leath-Warren, Alda Lea, NP  ?Semaglutide-Weight Management (WEGOVY) 0.25 MG/0.5ML SOAJ Inject 0.25 mg into the skin once a week. 05/22/21     ?Semaglutide-Weight Management (WEGOVY) 0.25 MG/0.5ML SOAJ Inject 0.25 mg into the skin once a week 05/22/21     ?triamterene-hydrochlorothiazide (DYAZIDE) 37.5-25 MG capsule Take 1 capsule by mouth daily 04/23/21     ?   ? ?Allergies    ?Doxycycline and Minocycline    ? ?Review of Systems   ?Review of Systems  ?Constitutional:  Negative for chills and fever.  ?HENT:  Negative for sore throat.   ?Respiratory:  Positive for chest tightness and shortness of breath.   ?Cardiovascular:  Negative for chest pain and leg swelling.  ?Gastrointestinal:  Negative for abdominal pain, nausea and vomiting.  ?Genitourinary:  Negative for flank pain.  ?Musculoskeletal:  Negative for back pain.  ?Skin:  Negative for pallor and wound.  ?Neurological:  Negative for light-headedness and numbness.  ?All other systems reviewed and are negative. ? ?Physical Exam ?Updated Vital Signs ?BP 127/87   Pulse 71   Temp 98.2 ?F (36.8 ?C) (Oral)   Resp 13   LMP 01/09/2022   SpO2 100%  ?Physical Exam ?Vitals and nursing note reviewed.  ?Constitutional:   ?   General: She is not in acute distress. ?   Appearance: She is well-developed.  ?HENT:  ?   Head: Normocephalic and atraumatic.  ?Eyes:  ?   Conjunctiva/sclera: Conjunctivae normal.  ?Cardiovascular:  ?   Rate and Rhythm: Normal rate and regular rhythm.  ?   Heart sounds: No murmur heard. ?Pulmonary:  ?   Effort: Pulmonary effort is normal. No respiratory distress.  ?   Breath sounds: Normal breath sounds. No decreased breath sounds or wheezing.  ?   Comments: Lungs are clear to auscultation without rales, rhonchi or wheezing.  ?Chest:  ?   Chest wall: No tenderness.  ?Abdominal:  ?   Palpations: Abdomen is soft.  ?   Tenderness: There is no abdominal tenderness.  ?Musculoskeletal:     ?   General: No swelling.  ?   Cervical back: Neck supple.  ?   Right lower leg: No edema.  ?   Left lower leg: No edema.  ?   Comments: No BL pitting edema.   ?Skin: ?   General: Skin is warm and dry.  ?   Capillary Refill: Capillary refill takes less than 2 seconds.  ?Neurological:  ?   Mental Status: She is alert.  ?Psychiatric:     ?   Mood and Affect: Mood normal.  ? ? ?ED Results / Procedures / Treatments   ?Labs ?(all labs ordered are listed, but only abnormal  results are displayed) ?Labs Reviewed  ?BASIC METABOLIC PANEL - Abnormal; Notable for the following components:  ?    Result Value  ? Potassium 3.1 (*)   ? All other components within normal limits  ?CBC WITH DIFFERENTIAL/PLATELET - Abnormal; Notable for the following components:  ? WBC 11.9 (*)   ? Neutro Abs 8.0 (*)   ? All other components within normal limits  ?D-DIMER, QUANTITATIVE (NOT AT [facility]) - Abnormal; Notable for the following components:  ? D-Dimer, Quant 1.46 (*)   ? All other  components within normal limits  ?RESP PANEL BY RT-PCR (FLU A&B, COVID) [id number]  ?TROPONIN I (HIGH SENSITIVITY)  ?TROPONIN I (HIGH SENSITIVITY)  ? ? ?EKG ?EKG Interpretation ? ?Date/Time:  Friday January 09 2022 14:37:35 EDT ?Ventricular Rate:  65 ?PR Interval:  171 ?QRS Duration: 84 ?QT Interval:  397 ?QTC Calculation: 413 ?R Axis:   34 ?Text Interpretation: Sinus rhythm Confirmed by Lacretia Leigh ([id number]) on 01/09/2022 6:35:58 PM ? ?Radiology ?DG Chest 2 View ? ?Result Date: 01/08/2022 ?CLINICAL DATA:  Dyspnea EXAM: CHEST - 2 VIEW COMPARISON:  04/04/2020 FINDINGS: The heart size and mediastinal contours are within normal limits. Both lungs are clear. The visualized skeletal structures are unremarkable. IMPRESSION: No active cardiopulmonary disease. Electronically Signed   By: Fidela Salisbury M.D.   On: 01/08/2022 18:51  ? ?CT Angio Chest PE W and/or Wo Contrast ? ?Result Date: 01/09/2022 ?CLINICAL DATA:  Fatigue, URI last week with shortness of breath yesterday, elevated D-dimer EXAM: CT ANGIOGRAPHY CHEST WITH CONTRAST TECHNIQUE: Multidetector CT imaging of the chest was performed using the standard protocol during bolus administration of intravenous contrast. Multiplanar CT image reconstructions and MIPs were obtained to evaluate the vascular anatomy. RADIATION DOSE REDUCTION: This exam was performed according to the departmental dose-optimization program which includes automated exposure control, adjustment of the mA and/or kV according  to patient size and/or use of iterative reconstruction technique. CONTRAST:  2m OMNIPAQUE IOHEXOL 350 MG/ML SOLN COMPARISON:  Chest radiograph 1 day prior FINDINGS: Cardiovascular: There is adequate opacificati

## 2022-01-09 NOTE — ED Triage Notes (Signed)
Patient c/o fatigue today. States had URI last week with SOB yesterday. Seen at [facility] yesterday. Denies fever, cough, N/V/D. Denies chest pain. ?

## 2022-01-09 NOTE — Discharge Instructions (Signed)
Your laboratory results on today's visit were within normal limit. ? ?We discussed the CT of your angio chest. ? ?I do recommend outpatient follow-up with cardiology if pain is ongoing. ? ?Return to the emergency department if you experience any worsening shortness of breath, chest pain, worsening symptoms. ?

## 2022-01-09 NOTE — ED Notes (Signed)
Pt states understanding of dc instructions, importance of follow up. Pt denies questions or concerns and declined transportation assistance upon dc. Pt ambulated w/ a steady gait w/o need for assistance. No belongings left in room upon dc. ? ?

## 2022-01-14 ENCOUNTER — Ambulatory Visit: Payer: 59 | Admitting: Cardiology

## 2022-01-15 ENCOUNTER — Encounter: Payer: Self-pay | Admitting: Internal Medicine

## 2022-01-15 ENCOUNTER — Encounter: Payer: Self-pay | Admitting: Cardiology

## 2022-01-15 ENCOUNTER — Ambulatory Visit (INDEPENDENT_AMBULATORY_CARE_PROVIDER_SITE_OTHER): Payer: 59 | Admitting: Internal Medicine

## 2022-01-15 ENCOUNTER — Telehealth: Payer: Self-pay | Admitting: Internal Medicine

## 2022-01-15 ENCOUNTER — Other Ambulatory Visit (HOSPITAL_COMMUNITY): Payer: Self-pay

## 2022-01-15 VITALS — BP 150/92 | HR 90 | Ht 63.0 in | Wt 230.6 lb

## 2022-01-15 DIAGNOSIS — R002 Palpitations: Secondary | ICD-10-CM | POA: Diagnosis not present

## 2022-01-15 MED ORDER — DILTIAZEM HCL ER COATED BEADS 120 MG PO CP24
120.0000 mg | ORAL_CAPSULE | Freq: Every day | ORAL | 3 refills | Status: DC
  Filled 2022-01-15: qty 90, 90d supply, fill #0

## 2022-01-15 NOTE — Telephone Encounter (Signed)
Okay with me 

## 2022-01-15 NOTE — Telephone Encounter (Signed)
?  Pt is requesting to switch from Dr. Harl Bowie to Dr. Johney Frame. She said, one of her friends recommended Dr. Johney Frame ?

## 2022-01-15 NOTE — Patient Instructions (Signed)
Medication Instructions:  ?START: DILTIAZEM '120mg'$  ONCE DAILY  ?*If you need a refill on your cardiac medications before your next appointment, please call your pharmacy* ? ?Lab Work: ?None Ordered At This Time.  ?If you have labs (blood work) drawn today and your tests are completely normal, you will receive your results only by: ?MyChart Message (if you have MyChart) OR ?A paper copy in the mail ?If you have any lab test that is abnormal or we need to change your treatment, we will call you to review the results. ? ?Testing/Procedures: ?None Ordered At This Time.  ? ?Follow-Up: ?At [facility], you and your health needs are our priority.  As part of our continuing mission to provide you with exceptional heart care, we have created designated Provider Care Teams.  These Care Teams include your primary Cardiologist (physician) and Advanced Practice Providers (APPs -  Physician Assistants and Nurse Practitioners) who all work together to provide you with the care you need, when you need it. ? ?Your next appointment:   ?AS NEEDED  ? ?The format for your next appointment:   ?In Person ? ?Provider:   ?Janina Mayo, MD   ? ?Important Information About Sugar ? ? ? ? ?  ?

## 2022-01-15 NOTE — Telephone Encounter (Signed)
error 

## 2022-01-15 NOTE — Progress Notes (Signed)
?Cardiology Office Note:   ? ?Date:  01/15/2022  ? ?ID:  Peggy Wilkerson, DOB [birth year], MRN [id number] ? ?PCP:  Anne Ng, MD ?  ?[facility] Providers ?Cardiologist:  Janina Mayo, MD    ? ?Referring MD: Anne Ng, MD  ? ?No chief complaint on file. ?Palpitations/SOB ? ?History of Present Illness:   ? ?Peggy Wilkerson is a 51 y.o. female with a hx of HTN, anxiety, GERD, referral for SOB/palpitations ? ?Per [name] documentation: "51 year old female with no past medical history presents to the ED with a chief complaint of shortness of breath which began yesterday.  Patient was evaluated urgent care and referred to the ED for further evaluation.  However, patient reports she was unable to be seen yesterday.  She does states having a upper respiratory infection last week, feels that it is "hard for her to get a deep breath.  Did endorse some chest pressure along the substernal area without any radiation. ?Has not taking any medication for improvement in symptoms.  After her evaluation in urgent care yesterday, she thought this was likely reflux, prescribed Protonix however did not pick up the medication.  Did have an EKG at urgent care which was normal.  Denies any fever, cough, prior history of heart failure.  Patient is currently on Bystolic, HCTZ for blood pressure control. NO prio hx of CAD, no prior hx of blood clots. " ? ?Underwent CTA, no PE. Cxray unremarkable. Troponins were negative. ? ?EKG 01/09/2022- NSR ? ?She feels better. She is concerned her bystolic is contributing to high heart rates. She notes palpitations. She has tried many blood pressure medications which she thinks cause palpitations. She notes high heart rates with metop. She lives in [name]. She had a heart monitor , she had no significant arrhythmias. She had 0.01% of PVCs.  She is pre-menopausal and notes significant job stress. Also had life stressors. She notes panic attacks. ? ? ?Past Medical History:  ?Diagnosis Date  ?  Anemia   ? Anxiety   ? GERD (gastroesophageal reflux disease)   ? Hepatic hemangioma   ? Hepatic steatosis   ? Hypertension   ? IBS (irritable bowel syndrome)   ? ? ?Past Surgical History:  ?Procedure Laterality Date  ? BREAST BIOPSY    ? CHOLECYSTECTOMY    ? ? ?Current Medications: ?Current Meds  ?Medication Sig  ? chlorthalidone (HYGROTON) 25 MG tablet Take 1/2 tablet by mouth once a day  ? diltiazem (CARDIZEM CD) 120 MG 24 hr capsule Take 1 capsule (120 mg total) by mouth daily.  ? levalbuterol (XOPENEX HFA) 45 MCG/ACT inhaler Inhale 2 puffs into the lungs every 6 (six) hours as needed for shortness of breath.  ? levalbuterol (XOPENEX) 1.25 MG/3ML nebulizer solution Inhale 1 vial via nebulizer every 4 (four) hours as needed for wheezing.  ? Liraglutide -Weight Management (SAXENDA) 18 MG/3ML SOPN Inject 3 mg subcutaneously once a day  ? triamterene-hydrochlorothiazide (DYAZIDE) 37.5-25 MG capsule Take 1 capsule by mouth daily  ?  ? ?Allergies:   Doxycycline and Minocycline  ? ?Social History  ? ?Socioeconomic History  ? Marital status: Married  ?  Spouse name: Not on file  ? Number of children: Not on file  ? Years of education: Not on file  ? Highest education level: Not on file  ?Occupational History  ? Not on file  ?Tobacco Use  ? Smoking status: Never  ? Smokeless tobacco: Never  ?Vaping Use  ? Vaping Use:  Never used  ?Substance and Sexual Activity  ? Alcohol use: Never  ? Drug use: Never  ? Sexual activity: Not on file  ?Other Topics Concern  ? Not on file  ?Social History Narrative  ? ** Merged History Encounter **  ?    ? ?Social Determinants of Health  ? ?Financial Resource Strain: Not on file  ?Food Insecurity: Not on file  ?Transportation Needs: Not on file  ?Physical Activity: Not on file  ?Stress: Not on file  ?Social Connections: Not on file  ?  ? ?Family History: ?The patient's family history includes Breast cancer in her paternal aunt; Cancer in her father; Diabetes in her maternal grandmother;  Hypertension in her father. ? ?ROS:   ?Please see the history of present illness.    ? All other systems reviewed and are negative. ? ?EKGs/Labs/Other Studies Reviewed:   ? ?The following studies were reviewed today: ? ? ?EKG:  EKG is  ordered today.  The ekg ordered today demonstrates  ? ?NSR ? ?Recent Labs: ?01/09/2022: BUN 12; Creatinine, Ser 0.71; Hemoglobin 12.8; Platelets 254; Potassium 3.1; Sodium 139  ?Recent Lipid Panel ?No results found for: CHOL, TRIG, HDL, CHOLHDL, VLDL, LDLCALC, LDLDIRECT ? ? ?Risk Assessment/Calculations:   ?  ? ?    ? ?Physical Exam:   ? ?VS:  ? ?Vitals:  ? 01/15/22 1327  ?BP: (!) 150/92  ?Pulse: 90  ?SpO2: 98%  ? ? ? ? BP (!) 150/92   Pulse 90   Ht '5\' 3"'$  (1.6 m)   Wt 230 lb 9.6 oz (104.6 kg)   LMP 01/09/2022   SpO2 98%   BMI 40.85 kg/m?    ? ?Wt Readings from Last 3 Encounters:  ?01/15/22 230 lb 9.6 oz (104.6 kg)  ?04/16/21 226 lb (102.5 kg)  ?04/04/20 220 lb (99.8 kg)  ?  ? ?GEN:  Well nourished, well developed in no acute distress ?HEENT: Normal ?NECK: No JVD; No carotid bruits ?LYMPHATICS: No lymphadenopathy ?CARDIAC: RRR, no murmurs, rubs, gallops ?RESPIRATORY:  Clear to auscultation without rales, wheezing or rhonchi  ?ABDOMEN: Soft, non-tender, non-distended ?MUSCULOSKELETAL:  No edema; No deformity  ?SKIN: Warm and dry ?NEUROLOGIC:  Alert and oriented x 3 ?PSYCHIATRIC:  Normal affect  ? ?ASSESSMENT:   ? ?HTN: Does not take ambulatory BP readings. She states BB attributed to high heart rates. States she has tried most classes of antihypertenisves and notes intolerance and concerned related to palpitations. We discussed possible contribution of perimenopause. Can try diltiazem, she is willing to try this.  ? ?Palpitations: saw cardiology at [facility]. Zio showed sinus rhythm. PVCs. No significant SVT. ? ?SOB: atypical for coronary disease. Not related to activity. No chest pressure.  ? ? ?PLAN:   ? ?In order of problems listed above: ? ?Started cardizem 120 mg XR ?Follow up  PRN ? ?   ? ?  ?Medication Adjustments/Labs and Tests Ordered: ?Current medicines are reviewed at length with the patient today.  Concerns regarding medicines are outlined above.  ?No orders of the defined types were placed in this encounter. ? ?Meds ordered this encounter  ?Medications  ? diltiazem (CARDIZEM CD) 120 MG 24 hr capsule  ?  Sig: Take 1 capsule (120 mg total) by mouth daily.  ?  Dispense:  90 capsule  ?  Refill:  3  ? ? ?Patient Instructions  ?Medication Instructions:  ?START: DILTIAZEM '120mg'$  ONCE DAILY  ?*If you need a refill on your cardiac medications before your next appointment, please call  your pharmacy* ? ?Lab Work: ?None Ordered At This Time.  ?If you have labs (blood work) drawn today and your tests are completely normal, you will receive your results only by: ?MyChart Message (if you have MyChart) OR ?A paper copy in the mail ?If you have any lab test that is abnormal or we need to change your treatment, we will call you to review the results. ? ?Testing/Procedures: ?None Ordered At This Time.  ? ?Follow-Up: ?At [facility], you and your health needs are our priority.  As part of our continuing mission to provide you with exceptional heart care, we have created designated Provider Care Teams.  These Care Teams include your primary Cardiologist (physician) and Advanced Practice Providers (APPs -  Physician Assistants and Nurse Practitioners) who all work together to provide you with the care you need, when you need it. ? ?Your next appointment:   ?AS NEEDED  ? ?The format for your next appointment:   ?In Person ? ?Provider:   ?Janina Mayo, MD   ? ?Important Information About Sugar ? ? ? ? ?   ? ?Signed, ?Janina Mayo, MD  ?01/15/2022 2:00 PM    ?[place] ?

## 2022-01-16 NOTE — Addendum Note (Signed)
Addended by: Hinton Dyer on: 01/16/2022 02:28 PM ? ? Modules accepted: Orders ? ?

## 2022-01-23 ENCOUNTER — Other Ambulatory Visit (HOSPITAL_COMMUNITY): Payer: Self-pay

## 2022-01-28 ENCOUNTER — Other Ambulatory Visit (HOSPITAL_COMMUNITY): Payer: Self-pay

## 2022-01-28 DIAGNOSIS — J069 Acute upper respiratory infection, unspecified: Secondary | ICD-10-CM | POA: Diagnosis not present

## 2022-01-28 DIAGNOSIS — I10 Essential (primary) hypertension: Secondary | ICD-10-CM | POA: Diagnosis not present

## 2022-01-28 MED ORDER — METHYLPREDNISOLONE 4 MG PO TBPK
ORAL_TABLET | ORAL | 0 refills | Status: DC
  Filled 2022-01-28: qty 21, 6d supply, fill #0

## 2022-01-28 MED ORDER — LOSARTAN POTASSIUM 50 MG PO TABS
50.0000 mg | ORAL_TABLET | Freq: Every day | ORAL | 1 refills | Status: DC
  Filled 2022-01-28: qty 90, 90d supply, fill #0

## 2022-02-03 ENCOUNTER — Encounter: Payer: Self-pay | Admitting: Gastroenterology

## 2022-02-03 ENCOUNTER — Other Ambulatory Visit (HOSPITAL_COMMUNITY): Payer: Self-pay

## 2022-02-03 ENCOUNTER — Ambulatory Visit (INDEPENDENT_AMBULATORY_CARE_PROVIDER_SITE_OTHER): Payer: 59 | Admitting: Gastroenterology

## 2022-02-03 VITALS — BP 152/78 | HR 106 | Ht 63.0 in | Wt 228.4 lb

## 2022-02-03 DIAGNOSIS — K219 Gastro-esophageal reflux disease without esophagitis: Secondary | ICD-10-CM

## 2022-02-03 MED ORDER — FAMOTIDINE 20 MG PO TABS
20.0000 mg | ORAL_TABLET | Freq: Every day | ORAL | 5 refills | Status: AC
  Filled 2022-02-03: qty 30, 30d supply, fill #0

## 2022-02-03 MED ORDER — PANTOPRAZOLE SODIUM 40 MG PO TBEC
40.0000 mg | DELAYED_RELEASE_TABLET | Freq: Two times a day (BID) | ORAL | 3 refills | Status: AC
  Filled 2022-02-03: qty 60, 30d supply, fill #0

## 2022-02-03 NOTE — Patient Instructions (Addendum)
We have sent the following medications to your pharmacy for you to pick up at your convenience: Pantoprazole and Pepcid ? ?It has been recommended to you by your physician that you have a(n) Colonoscopy/Endoscopy completed. Per your request, we did not schedule the procedure(s) today. Please contact our office at [phone] should you decide to have the procedure completed. You will be scheduled for a pre-visit and procedure at that time.  ? ?If you are age 51 or older, your body mass index should be between 23-30. Your Body mass index is 40.46 kg/m?Marland Kitchen If this is out of the aforementioned range listed, please consider follow up with your Primary Care Provider. ? ?If you are age 69 or younger, your body mass index should be between 19-25. Your Body mass index is 40.46 kg/m?Marland Kitchen If this is out of the aformentioned range listed, please consider follow up with your Primary Care Provider.  ? ?________________________________________________________ ? ?The [facility] providers would like to encourage you to use [facility] to communicate with providers for non-urgent requests or questions.  Due to long hold times on the telephone, sending your provider a message by [facility] may be a faster and more efficient way to get a response.  Please allow 48 business hours for a response.  Please remember that this is for non-urgent requests.  ?_______________________________________________________  ? ?I appreciate the  opportunity to care for you ? ?Thank You  ? ?Jessica Zehr,PA-C     ?

## 2022-02-03 NOTE — Progress Notes (Signed)
? ? ? ?02/03/2022 ?Peggy Wilkerson ?[id number] ?[birth year] ? ? ?HISTORY OF PRESENT ILLNESS: This is a 51 year old female who is a patient of Dr. Vena Rua.  She was seen by me in July 2022 at which time she was scheduled for colonoscopy and EGD, but she never proceeded.  She has never had a colonoscopy in the past, but had reported a couple of EGDs through [facility] previously.  She has past medical history of anxiety, GERD, hepatic steatosis, hypertension, and suspected IBS.  She was seeing gastroenterology through [facility] up until 2020.  She thinks her last EGD was probably about 2014.  When I saw her she was only using Prevacid for short courses at a time as needed.  More recently she has been having issues with getting a deep breath and a lot of tightness in the center of her chest, etc.  She actually went to the ER recently and her D-dimer was slightly elevated, but CTA was negative.  EKG, troponins, etc. are fine.  Cardiology has told her before that she is low risk and that had not planned for any further work-up.  She says that she started on pantoprazole and after taking 1 pill she noticed significant relief of her symptoms.  She says that she wonders if this has been severe reflux all along. ? ? ?Past Medical History:  ?Diagnosis Date  ? Anemia   ? Anxiety   ? GERD (gastroesophageal reflux disease)   ? Hepatic hemangioma   ? Hepatic steatosis   ? Hypertension   ? IBS (irritable bowel syndrome)   ? ?Past Surgical History:  ?Procedure Laterality Date  ? BREAST BIOPSY    ? CHOLECYSTECTOMY    ? ? reports that she has never smoked. She has never used smokeless tobacco. She reports that she does not drink alcohol and does not use drugs. ?family history includes Breast cancer in her paternal aunt; Cancer in her father; Diabetes in her maternal grandmother; Hypertension in her father. ?Allergies  ?Allergen Reactions  ? Doxycycline Other (See Comments)  ?  Rapid HR ?Rapid HR ?  ? Minocycline Other (See Comments)  ?  Rapid  HR ?Rapid HR ?  ? ? ?  ?Outpatient Encounter Medications as of 02/03/2022  ?Medication Sig  ? levalbuterol (XOPENEX HFA) 45 MCG/ACT inhaler Inhale 2 puffs into the lungs every 6 (six) hours as needed for shortness of breath.  ? levalbuterol (XOPENEX) 1.25 MG/3ML nebulizer solution Inhale 1 vial via nebulizer every 4 (four) hours as needed for wheezing.  ? pantoprazole (PROTONIX) 40 MG tablet Take 40 mg by mouth daily.  ? [DISCONTINUED] chlorthalidone (HYGROTON) 25 MG tablet Take 1/2 tablet by mouth once a day (Patient not taking: Reported on 02/03/2022)  ? [DISCONTINUED] diltiazem (CARDIZEM CD) 120 MG 24 hr capsule Take 1 capsule (120 mg total) by mouth daily. (Patient not taking: Reported on 02/03/2022)  ? [DISCONTINUED] Liraglutide -Weight Management (SAXENDA) 18 MG/3ML SOPN Inject 3 mg subcutaneously once a day (Patient not taking: Reported on 02/03/2022)  ? [DISCONTINUED] losartan (COZAAR) 50 MG tablet TAKE 1 TABLET BY MOUTH EVERY DAY (Patient not taking: Reported on 02/03/2022)  ? [DISCONTINUED] methylPREDNISolone (MEDROL) 4 MG TBPK tablet Take by mouth as directed 6, 5, 4, 3, 2, 1 taper per pack instructions. (Patient not taking: Reported on 02/03/2022)  ? [DISCONTINUED] triamterene-hydrochlorothiazide (DYAZIDE) 37.5-25 MG capsule Take 1 capsule by mouth daily (Patient not taking: Reported on 02/03/2022)  ? ?No facility-administered encounter medications on file as of 02/03/2022.  ? ? ? ?  REVIEW OF SYSTEMS  : All other systems reviewed and negative except where noted in the History of Present Illness. ? ? ?PHYSICAL EXAM: ?BP (!) 152/78   Pulse (!) 106   Ht '5\' 3"'$  (1.6 m)   Wt 228 lb 6.4 oz (103.6 kg)   LMP 01/09/2022   SpO2 99%   BMI 40.46 kg/m?  ?General: Well developed white female in no acute distress ?Head: Normocephalic and atraumatic ?Eyes:  Sclerae anicteric, conjunctiva pink. ?Ears: Normal auditory acuity ?Lungs: Clear throughout to auscultation; no W/R/R. ?Heart: Regular rate and rhythm; no M/R/G. ?Abdomen: Soft,  non-distended.  BS present.  Mild epigastric TTP. ?Musculoskeletal: Symmetrical with no gross deformities  ?Skin: No lesions on visible extremities ?Extremities: No edema  ?Neurological: Alert oriented x 4, grossly non-focal ?Psychological:  Alert and cooperative. Normal mood and affect ? ?ASSESSMENT AND PLAN: ?*GERD: Having a lot of severe symptoms that have responded well just recently to pantoprazole.  Previously was only taking Prevacid for short courses at a time as needed when symptoms were worse.  She has had 2 EGDs in the past, the last maybe 2014.  We had scheduled for EGD when I saw her back in July 2022, but she never proceeded.  She would like to do that, but does not want to schedule right now.  For now we are going to have her do the pantoprazole regularly and I am going to have her increase it to twice a day.  Can add Pepcid at bedtime as well.  Prescriptions sent to pharmacy.  She can follow-up with me in about 6 weeks. ?*CRC screening:  Never had colonoscopy after her last visit here.  She does not want to schedule right now.  Will schedule at follow-up office visit. ? ? ?CC:  Anne Ng, MD ? ?  ?

## 2022-02-04 DIAGNOSIS — R5381 Other malaise: Secondary | ICD-10-CM | POA: Diagnosis not present

## 2022-02-04 DIAGNOSIS — Z6836 Body mass index (BMI) 36.0-36.9, adult: Secondary | ICD-10-CM | POA: Diagnosis not present

## 2022-02-04 DIAGNOSIS — R051 Acute cough: Secondary | ICD-10-CM | POA: Diagnosis not present

## 2022-02-04 DIAGNOSIS — M2550 Pain in unspecified joint: Secondary | ICD-10-CM | POA: Diagnosis not present

## 2022-02-04 DIAGNOSIS — J019 Acute sinusitis, unspecified: Secondary | ICD-10-CM | POA: Diagnosis not present

## 2022-02-04 DIAGNOSIS — E785 Hyperlipidemia, unspecified: Secondary | ICD-10-CM | POA: Diagnosis not present

## 2022-02-04 DIAGNOSIS — R079 Chest pain, unspecified: Secondary | ICD-10-CM | POA: Diagnosis not present

## 2022-02-04 DIAGNOSIS — R7309 Other abnormal glucose: Secondary | ICD-10-CM | POA: Diagnosis not present

## 2022-02-04 DIAGNOSIS — R1033 Periumbilical pain: Secondary | ICD-10-CM | POA: Diagnosis not present

## 2022-02-04 DIAGNOSIS — R0600 Dyspnea, unspecified: Secondary | ICD-10-CM | POA: Diagnosis not present

## 2022-02-04 DIAGNOSIS — J029 Acute pharyngitis, unspecified: Secondary | ICD-10-CM | POA: Diagnosis not present

## 2022-02-05 NOTE — Progress Notes (Signed)
Addendum: ?Reviewed and agree with assessment and management plan. ?Agree with recommendation for colonoscopy for screening as well. ?Corey Laski, Lajuan Lines, MD ? ?

## 2022-02-11 ENCOUNTER — Other Ambulatory Visit (HOSPITAL_COMMUNITY): Payer: Self-pay

## 2022-02-24 ENCOUNTER — Ambulatory Visit: Payer: 59 | Admitting: Cardiology

## 2022-02-24 ENCOUNTER — Other Ambulatory Visit (HOSPITAL_COMMUNITY): Payer: Self-pay

## 2022-02-24 MED ORDER — COVID-19 AT HOME ANTIGEN TEST VI KIT
PACK | 0 refills | Status: DC
  Filled 2022-02-24: qty 4, 4d supply, fill #0

## 2022-02-25 ENCOUNTER — Other Ambulatory Visit (HOSPITAL_COMMUNITY): Payer: Self-pay

## 2022-02-25 MED ORDER — PAXLOVID (300/100) 20 X 150 MG & 10 X 100MG PO TBPK
ORAL_TABLET | ORAL | 0 refills | Status: DC
  Filled 2022-02-25: qty 30, 5d supply, fill #0

## 2022-02-26 ENCOUNTER — Institutional Professional Consult (permissible substitution): Payer: 59 | Admitting: Internal Medicine

## 2022-03-04 ENCOUNTER — Other Ambulatory Visit (HOSPITAL_COMMUNITY): Payer: Self-pay

## 2022-03-06 ENCOUNTER — Other Ambulatory Visit (HOSPITAL_COMMUNITY): Payer: Self-pay

## 2022-03-09 ENCOUNTER — Ambulatory Visit (INDEPENDENT_AMBULATORY_CARE_PROVIDER_SITE_OTHER): Payer: 59 | Admitting: Internal Medicine

## 2022-03-09 ENCOUNTER — Encounter: Payer: Self-pay | Admitting: Internal Medicine

## 2022-03-09 VITALS — BP 140/80 | HR 101 | Temp 98.5°F | Ht 63.0 in | Wt 234.0 lb

## 2022-03-09 DIAGNOSIS — R0602 Shortness of breath: Secondary | ICD-10-CM

## 2022-03-09 DIAGNOSIS — J301 Allergic rhinitis due to pollen: Secondary | ICD-10-CM | POA: Diagnosis not present

## 2022-03-09 LAB — NITRIC OXIDE: Nitric Oxide: 6

## 2022-03-09 NOTE — Patient Instructions (Signed)
Please schedule follow up scheduled with myself in 1 year.  If my schedule is not open yet, we will contact you with a reminder closer to that time. Please call [phone] if you haven't heard from [place] a month before.   Breathing testing normal today.   Continue using rescue inhaler as needed. If you are needing use it more than twice a week you may require a daily maintenance inhaler.   Please have your blood work drawn to check for what environmental allergies you may hae.  If symptoms of allergies are not well controlled with an over the counter anti-histamine, let me know and we can try nasal sprays or a prescription strength allergy medication.   I can always seen you back sooner if needed.

## 2022-03-09 NOTE — Progress Notes (Signed)
Peggy Wilkerson    [id number]    03/17/1971  Primary Care Physician:Mackenzie, Loyal Jacobson, MD  Referring Physician: Anne Ng, MD [address] [place],  [place] [address] Reason for Consultation: shortness of breath Date of Consultation: 03/09/2022  Chief complaint:   Chief Complaint  Patient presents with   Consult    Pt states she is trying to figure out if she might have asthma. States about 2 months ago she had problems with SOB and ended up having to go to the ED to be evaluated.     HPI: Peggy Wilkerson is a 50 y.o. woman who presents for new patient evaluation of shortness of breath and possible asthma.   Had an urgent care and then an ED visit in April 2023 for shortness of breath and chest pressure. Underwent a CT Angio with elevated d-dimer which was negative. She feels her symptoms were related to allergies. She was also having runny nose, itchy, watery eyes.   She takes claritin once daily which does help with runny nose.   She was treated with an antibiotic and some breathing treatments and this helped.   She saw a heart doctor and said she was low risk for heart issues.   She has had bronchitis when she was young once - age 63.  No childhood respiratory disease.   Has had allergy testing before - SPT at the allergist in [place].  Showed she was allergic to dog saliva.   She has frequent throat clearing. Had covid 2 weeks ago but felt better with paxlovid.   Social history:  Occupation: Field seismologist at [employer]  Exposures: born and raised in [place], but lived in [place] for ten years. When she moved back allergies worsened.  Smoking history: never smoker, no passive smoke exposure.   Social History   Occupational History   Not on file  Tobacco Use   Smoking status: Never   Smokeless tobacco: Never  Vaping Use   Vaping Use: Never used  Substance and Sexual Activity   Alcohol use: Never   Drug use: Never   Sexual activity: Not on file     Relevant family history:  Family History  Problem Relation Age of Onset   Cancer Father    Hypertension Father    Breast cancer Paternal Aunt    Diabetes Maternal Grandmother     Past Medical History:  Diagnosis Date   Anemia    Anxiety    GERD (gastroesophageal reflux disease)    Hepatic hemangioma    Hepatic steatosis    Hypertension    IBS (irritable bowel syndrome)     Past Surgical History:  Procedure Laterality Date   BREAST BIOPSY     CHOLECYSTECTOMY       Physical Exam: Blood pressure 140/80, pulse (!) 101, temperature 98.5 F (36.9 C), temperature source Oral, height '5\' 3"'$  (1.6 m), weight 234 lb (106.1 kg), SpO2 99 %. Gen:      No acute distress ENT:  enlarged tonsils, no nasal polyps, mucus membranes moist Lungs:    No increased respiratory effort, symmetric chest wall excursion, clear to auscultation bilaterally, no wheezes or crackles CV:         Regular rate and rhythm; no murmurs, rubs, or gallops.  No pedal edema Abd:      + bowel sounds; soft, non-tender; no distension MSK: no acute synovitis of DIP or PIP joints, no mechanics hands.  Skin:  Warm and dry; no rashes Neuro: normal speech, no focal facial asymmetry Psych: alert and oriented x3, normal mood and affect   Data Reviewed/Medical Decision Making:  Independent interpretation of tests: Imaging:  Review of patient's ct angio chest May 2023 images revealed no acute process. The patient's images have been independently reviewed by me.    PFTs: I have personally reviewed the patient's PFTs and spirometry no airflow limitation     View : No data to display.          Labs:  Lab Results  Component Value Date   WBC 11.9 (H) 01/09/2022   HGB 12.8 01/09/2022   HCT 40.8 01/09/2022   MCV 93.4 01/09/2022   PLT 254 01/09/2022   Lab Results  Component Value Date   NA 139 01/09/2022   K 3.1 (L) 01/09/2022   CL 103 01/09/2022   CO2 27 01/09/2022     Immunization status:    There is no immunization history on file for this patient.   I reviewed prior external note(s) from ED visit, cardiology in care everywhere  I reviewed the result(s) of the labs and imaging as noted above.   I have ordered allergy test, spiro/feno  Assessment:  Seasonal allergic rhinitis Possible mild intermittent asthma  Plan/Recommendations: Continue daily anti-histamine.  Continue prn levalbuterol Will obtain region 2 immunocap testing.  Arlyce Harman and feno today were wnl.  If she is using saba more than twice a week may need maintenance or may need prn ics-laba.   We discussed disease management and progression at length today.   Return to Care: Return in about 1 year (around 03/10/2023).  Lenice Llamas, MD [facility]monary [facility]  CC: Anne Ng, MD

## 2022-03-12 ENCOUNTER — Encounter (INDEPENDENT_AMBULATORY_CARE_PROVIDER_SITE_OTHER): Payer: Self-pay

## 2022-03-12 DIAGNOSIS — Z0289 Encounter for other administrative examinations: Secondary | ICD-10-CM

## 2022-03-18 ENCOUNTER — Ambulatory Visit (INDEPENDENT_AMBULATORY_CARE_PROVIDER_SITE_OTHER): Payer: 59 | Admitting: Bariatrics

## 2022-03-20 ENCOUNTER — Ambulatory Visit: Payer: 59 | Admitting: Gastroenterology

## 2022-04-01 ENCOUNTER — Ambulatory Visit (INDEPENDENT_AMBULATORY_CARE_PROVIDER_SITE_OTHER): Payer: 59 | Admitting: Bariatrics

## 2022-05-13 ENCOUNTER — Encounter (INDEPENDENT_AMBULATORY_CARE_PROVIDER_SITE_OTHER): Payer: Self-pay

## 2022-06-06 ENCOUNTER — Other Ambulatory Visit (HOSPITAL_COMMUNITY): Payer: Self-pay

## 2022-08-10 ENCOUNTER — Other Ambulatory Visit (HOSPITAL_COMMUNITY): Payer: Self-pay

## 2022-08-11 ENCOUNTER — Other Ambulatory Visit (HOSPITAL_COMMUNITY): Payer: Self-pay

## 2022-08-11 MED ORDER — FAMOTIDINE 40 MG PO TABS
40.0000 mg | ORAL_TABLET | Freq: Every day | ORAL | 3 refills | Status: AC
  Filled 2022-08-11 – 2022-08-17 (×2): qty 90, 90d supply, fill #0

## 2022-08-14 ENCOUNTER — Other Ambulatory Visit (HOSPITAL_COMMUNITY): Payer: Self-pay

## 2022-08-17 ENCOUNTER — Other Ambulatory Visit (HOSPITAL_COMMUNITY): Payer: Self-pay

## 2022-08-18 ENCOUNTER — Other Ambulatory Visit (HOSPITAL_COMMUNITY): Payer: Self-pay

## 2022-09-15 ENCOUNTER — Telehealth: Payer: 59 | Admitting: Nurse Practitioner

## 2022-09-15 ENCOUNTER — Other Ambulatory Visit (HOSPITAL_COMMUNITY): Payer: Self-pay

## 2022-09-15 DIAGNOSIS — B9789 Other viral agents as the cause of diseases classified elsewhere: Secondary | ICD-10-CM | POA: Diagnosis not present

## 2022-09-15 DIAGNOSIS — J329 Chronic sinusitis, unspecified: Secondary | ICD-10-CM

## 2022-09-15 MED ORDER — FLUTICASONE PROPIONATE 50 MCG/ACT NA SUSP
2.0000 | Freq: Every day | NASAL | 6 refills | Status: AC
  Filled 2022-09-15: qty 16, 30d supply, fill #0

## 2022-09-15 NOTE — Progress Notes (Signed)
E-Visit for Sinus Problems  We are sorry that you are not feeling well.  Here is how we plan to help!  Based on what you have shared with me it looks like you have sinusitis.  It is not recommended you start antibiotic before 7-10 days of consistent and worsening congestion. Early stages are viral in nature and not responsive to antibiotics.   Sinusitis is inflammation and infection in the sinus cavities of the head.  Based on your presentation I believe you most likely have Acute Viral Sinusitis.This is an infection most likely caused by a virus. There is not specific treatment for viral sinusitis other than to help you with the symptoms until the infection runs its course.  You may use an oral decongestant such as Mucinex D or if you have glaucoma or high blood pressure use plain Mucinex. Saline nasal spray help and can safely be used as often as needed for congestion, I have prescribed: Fluticasone nasal spray two sprays in each nostril once a day  Providers prescribe antibiotics to treat infections caused by bacteria. Antibiotics are very powerful in treating bacterial infections when they are used properly. To maintain their effectiveness, they should be used only when necessary. Overuse of antibiotics has resulted in the development of superbugs that are resistant to treatment!    After careful review of your answers, I would not recommend an antibiotic for your condition.  Antibiotics are not effective against viruses and therefore should not be used to treat them. Common examples of infections caused by viruses include colds and flu   Some authorities believe that zinc sprays or the use of Echinacea may shorten the course of your symptoms.  Sinus infections are not as easily transmitted as other respiratory infection, however we still recommend that you avoid close contact with loved ones, especially the very young and elderly.  Remember to wash your hands thoroughly throughout the day as this  is the number one way to prevent the spread of infection!  Home Care: Only take medications as instructed by your medical team. Do not take these medications with alcohol. A steam or ultrasonic humidifier can help congestion.  You can place a towel over your head and breathe in the steam from hot water coming from a faucet. Avoid close contacts especially the very young and the elderly. Cover your mouth when you cough or sneeze. Always remember to wash your hands.  Get Help Right Away If: You develop worsening fever or sinus pain. You develop a severe head ache or visual changes. Your symptoms persist after you have completed your treatment plan.  Make sure you Understand these instructions. Will watch your condition. Will get help right away if you are not doing well or get worse.   Thank you for choosing an e-visit.  Your e-visit answers were reviewed by a board certified advanced clinical practitioner to complete your personal care plan. Depending upon the condition, your plan could have included both over the counter or prescription medications.  Please review your pharmacy choice. Make sure the pharmacy is open so you can pick up prescription now. If there is a problem, you may contact your provider through [employer] and have the prescription routed to another pharmacy.  Your safety is important to [place]. If you have drug allergies check your prescription carefully.   For the next 24 hours you can use MyChart to ask questions about today's visit, request a non-urgent call back, or ask for a work or school  excuse. You will get an email in the next two days asking about your experience. I hope that your e-visit has been valuable and will speed your recovery.    I spent approximately 5 minutes reviewing the patient's history, current symptoms and coordinating their care today.

## 2022-12-29 DIAGNOSIS — J019 Acute sinusitis, unspecified: Secondary | ICD-10-CM | POA: Diagnosis not present

## 2022-12-29 DIAGNOSIS — R051 Acute cough: Secondary | ICD-10-CM | POA: Diagnosis not present

## 2022-12-29 DIAGNOSIS — R5381 Other malaise: Secondary | ICD-10-CM | POA: Diagnosis not present

## 2022-12-30 ENCOUNTER — Other Ambulatory Visit (HOSPITAL_COMMUNITY): Payer: Self-pay

## 2022-12-30 MED ORDER — CEPHALEXIN 500 MG PO CAPS
500.0000 mg | ORAL_CAPSULE | Freq: Three times a day (TID) | ORAL | 0 refills | Status: AC
  Filled 2022-12-30: qty 42, 14d supply, fill #0

## 2022-12-30 MED ORDER — PREDNISONE 20 MG PO TABS
20.0000 mg | ORAL_TABLET | Freq: Every morning | ORAL | 0 refills | Status: AC
  Filled 2022-12-30: qty 5, 5d supply, fill #0

## 2023-05-26 DIAGNOSIS — E669 Obesity, unspecified: Secondary | ICD-10-CM | POA: Diagnosis not present

## 2023-05-26 DIAGNOSIS — I10 Essential (primary) hypertension: Secondary | ICD-10-CM | POA: Diagnosis not present

## 2023-05-26 DIAGNOSIS — F419 Anxiety disorder, unspecified: Secondary | ICD-10-CM | POA: Diagnosis not present

## 2024-01-09 IMAGING — CT CT ANGIO CHEST
2 of 6 series · 18 of 36 positions shown · IV contrast (agent unspecified)
Comparison: Chest radiograph 1 day prior

CLINICAL DATA: Fatigue, URI last week with shortness of breath
yesterday, elevated D-dimer

EXAM:
CT ANGIOGRAPHY CHEST WITH CONTRAST
TECHNIQUE: Multidetector CT imaging of the chest was performed using the
standard protocol during bolus administration of intravenous
contrast. Multiplanar CT image reconstructions and MIPs were
obtained to evaluate the vascular anatomy.

[Series 6: thins · axial · 0.65mm/px · z∈[+1404,+1636]mm · 17 of 262 slices shown]
[im 15/262  lung]
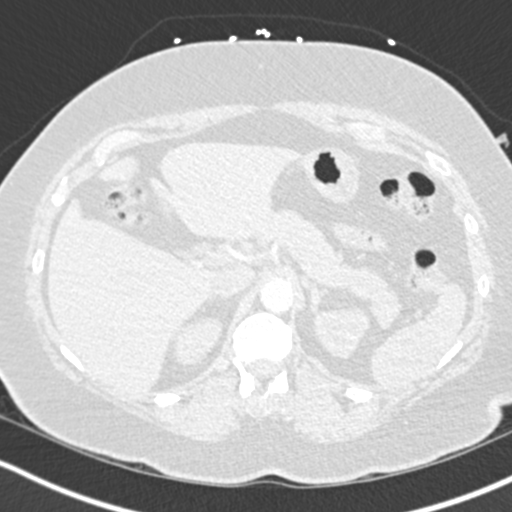
[im 30/262  mediastinal]
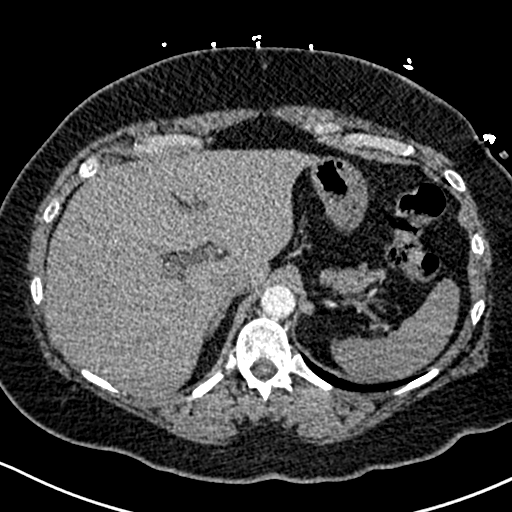
[im 44/262  lung]
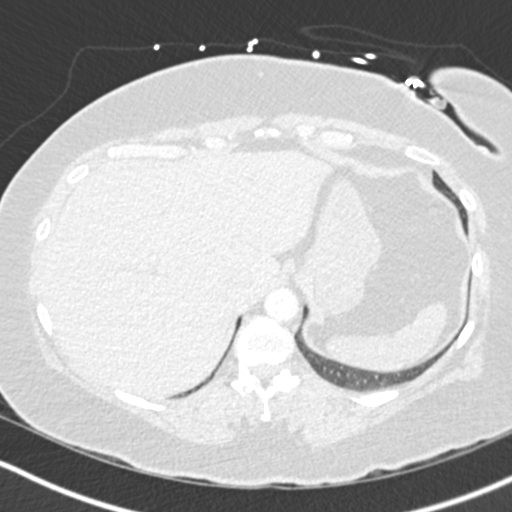
[im 59/262  mediastinal]
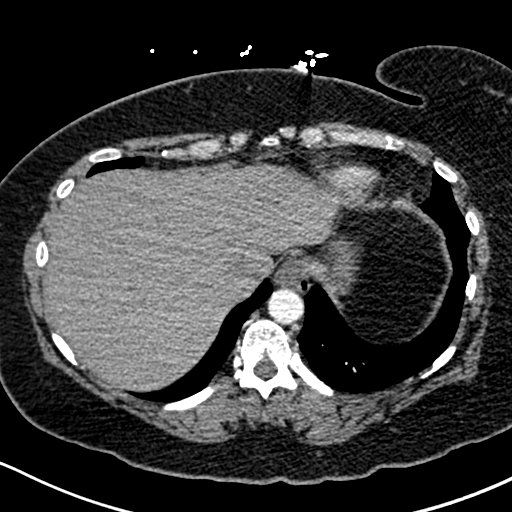
[im 73/262  lung]
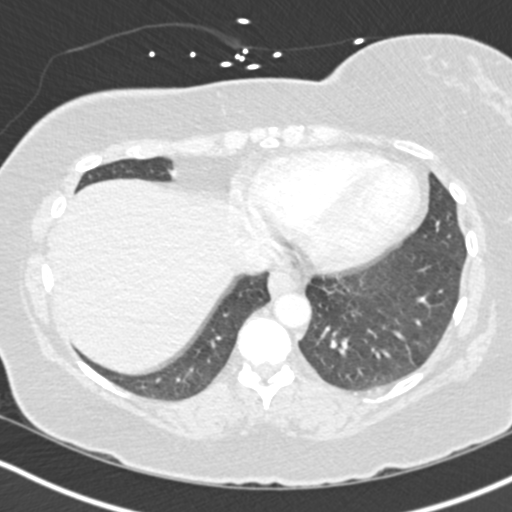
[im 88/262  mediastinal]
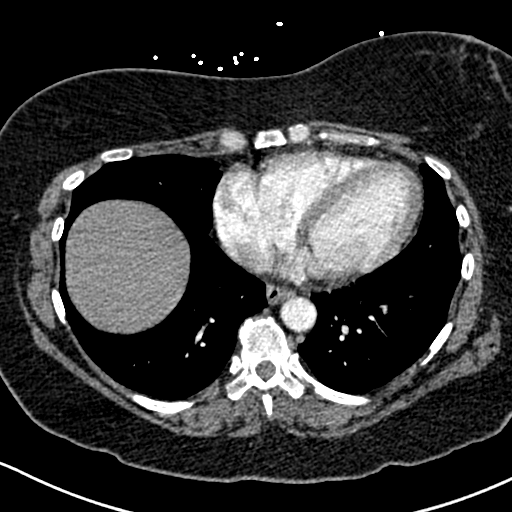
[im 102/262  lung]
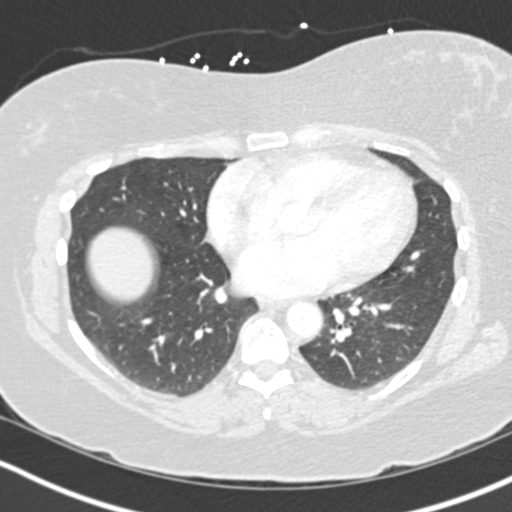
[im 117/262  mediastinal]
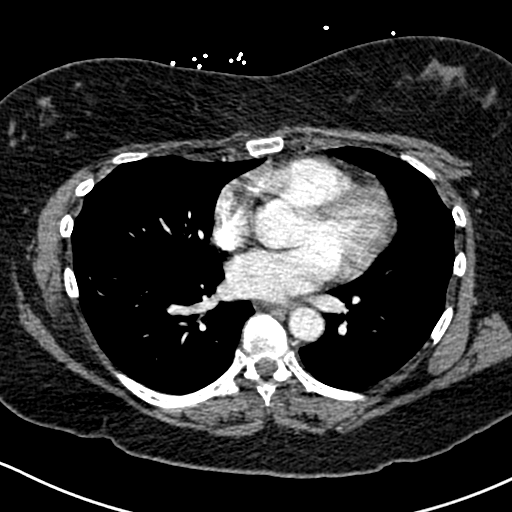
[im 131/262  lung]
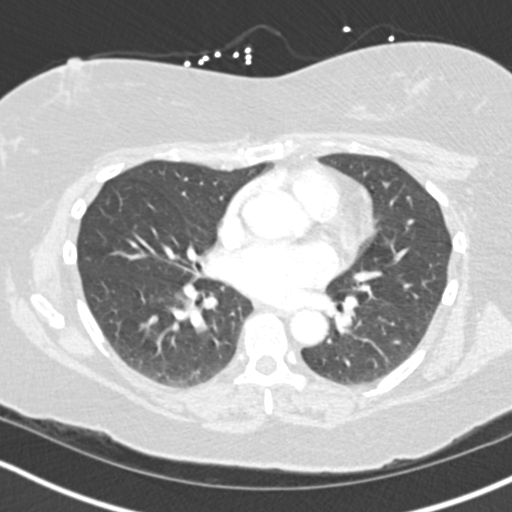
[im 146/262  mediastinal]
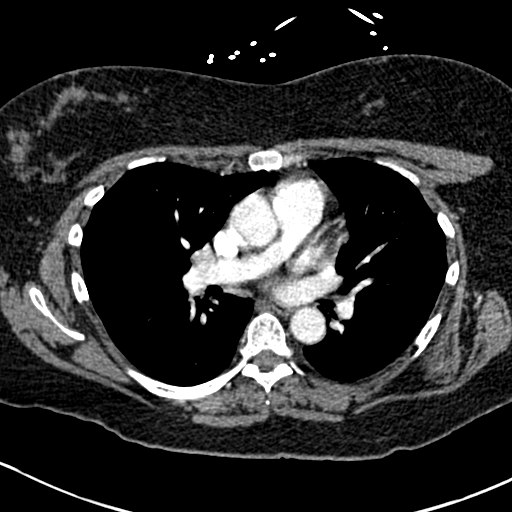
[im 160/262  lung]
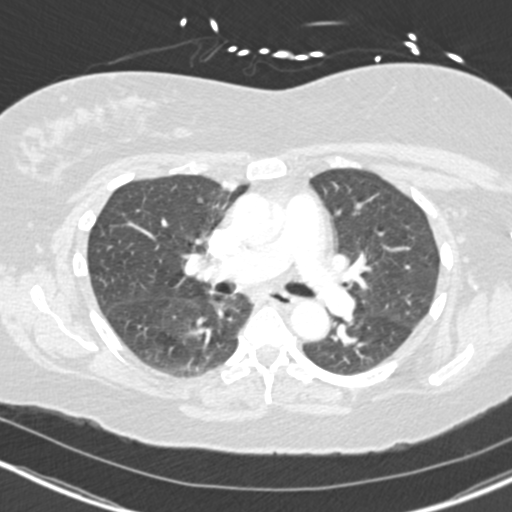
[im 175/262  mediastinal]
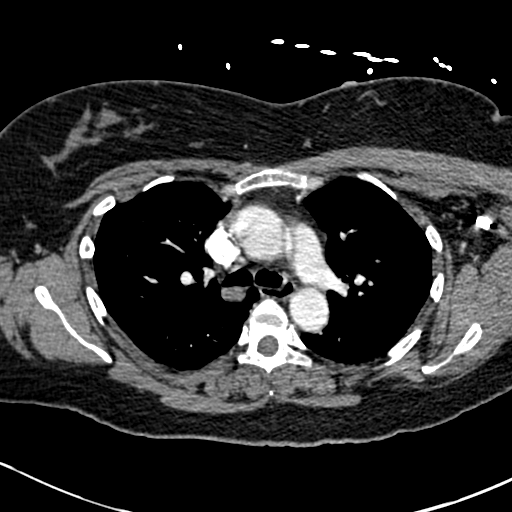
[im 189/262  lung]
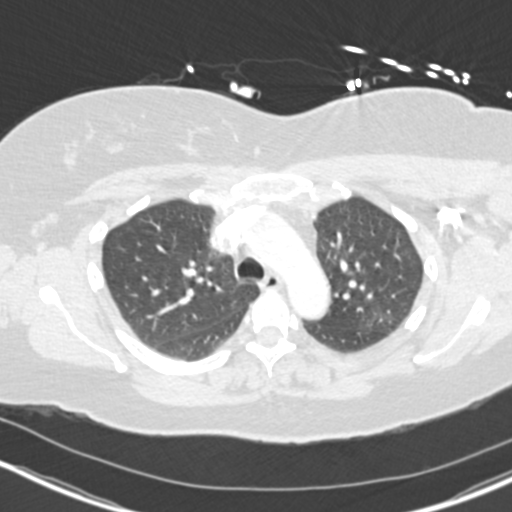
[im 204/262  mediastinal]
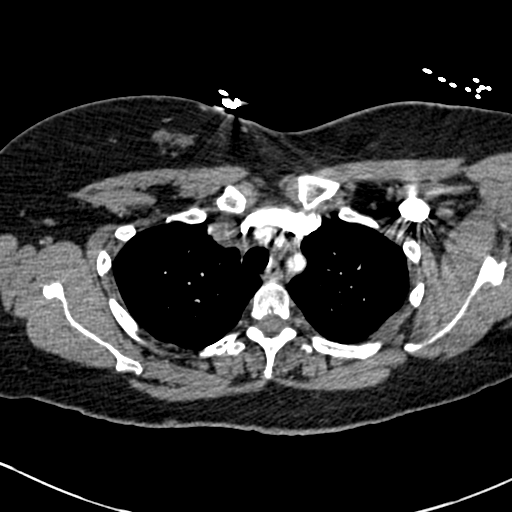
[im 218/262  lung]
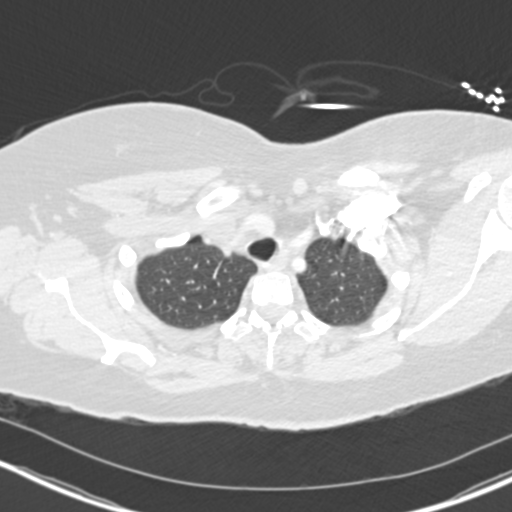
[im 233/262  mediastinal]
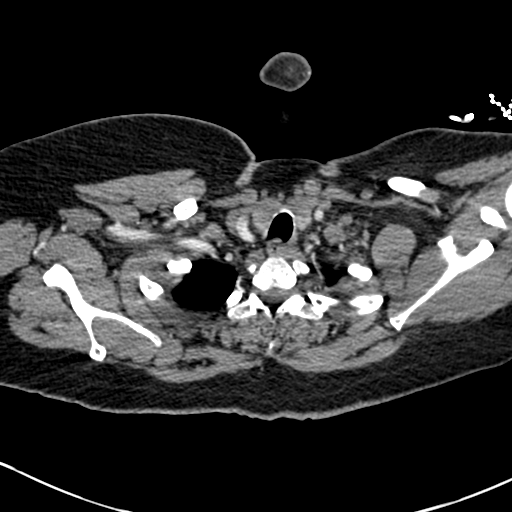
[im 247/262  lung]
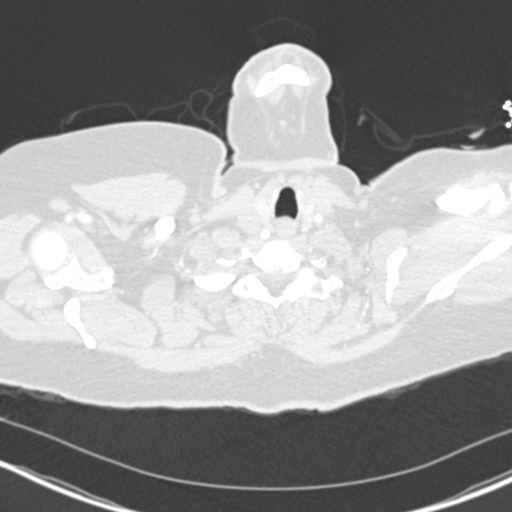

[Series 8: coronal mpr · coronal · 0.53mm/px · 1 of 120 slices shown]
[im 60/120  mediastinal]
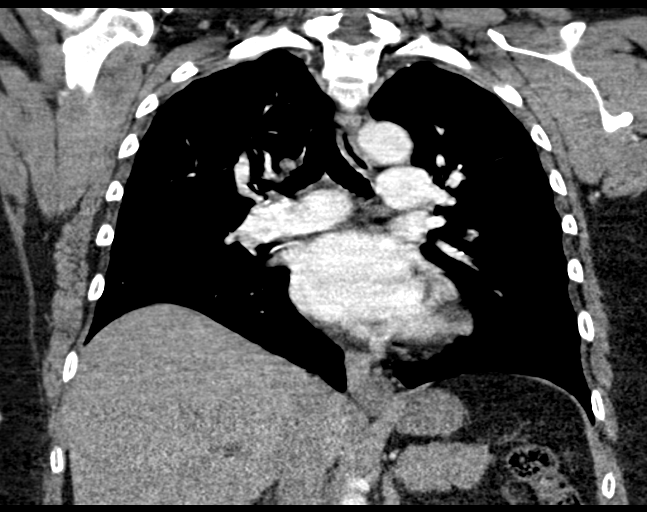

[18 of 36 positions shown; findings below may reference images not displayed]

RADIATION DOSE REDUCTION: This exam was performed according to the
departmental dose-optimization program which includes automated
exposure control, adjustment of the mA and/or kV according to
patient size and/or use of iterative reconstruction technique.

CONTRAST:  80mL OMNIPAQUE IOHEXOL 350 MG/ML SOLN
FINDINGS: Cardiovascular: There is adequate opacification of the pulmonary
arteries to the segmental level. Within these confines, the distal
arteries are not well evaluated. There is no evidence of pulmonary
embolism. The heart is not enlarged. There is no pericardial
effusion. The thoracic aorta is normal in appearance.

Mediastinum/Nodes: The thyroid is unremarkable. The esophagus is
grossly unremarkable. There is a small hiatal hernia. There is no
mediastinal, hilar, or axillary lymphadenopathy.

Lungs/Pleura: The trachea and central airways are patent.

The lungs are clear, with no focal consolidation or pulmonary edema.
There is no pleural effusion or pneumothorax.

There is no suspicious nodule.

Upper Abdomen: There is a 0.8 cm focus of hypodensity in hepatic
segment III and a 1.0 cm focus of hypodensity in segment YESENIA/IVB.
The imaged portions of the upper abdominal viscera are otherwise
unremarkable.

Musculoskeletal: There is no acute osseous abnormality or aggressive
osseous lesion.

Review of the MIP images confirms the above findings.
IMPRESSION: 1. No evidence of pulmonary embolism or other acute cardiopulmonary
pathology.
2. Two hypodense lesions in the liver described above are
indeterminate. Recommend nonemergent MRI of the abdomen with and
without contrast for further evaluation.
